# Patient Record
Sex: Male | Born: 1995 | Hispanic: Yes | Marital: Single | Smoking: Never smoker
Health system: Southern US, Community
[De-identification: ages and names within clinical notes are randomized; demographics above are authoritative.]

## PROBLEM LIST (undated history)

## (undated) DIAGNOSIS — G43909 Migraine, unspecified, not intractable, without status migrainosus: Secondary | ICD-10-CM

---
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
## 2014-06-26 ENCOUNTER — Emergency Department: Payer: Self-pay | Admitting: Emergency Medicine

## 2014-09-11 ENCOUNTER — Emergency Department: Payer: Self-pay | Admitting: Internal Medicine

## 2014-09-12 ENCOUNTER — Emergency Department: Payer: Self-pay | Admitting: Emergency Medicine

## 2014-12-04 ENCOUNTER — Emergency Department: Payer: Self-pay | Admitting: Emergency Medicine

## 2015-03-15 IMAGING — CR DG CHEST 2V
1 series · 2 of 2 positions shown · non-contrast
Comparison: None.

CLINICAL DATA: Cough.

EXAM:
CHEST  2 VIEW

[Series 1: dxr chest pa (or ap) and lateral · 0.14mm/px · 2 of 2 slices shown]
[im 1/2]
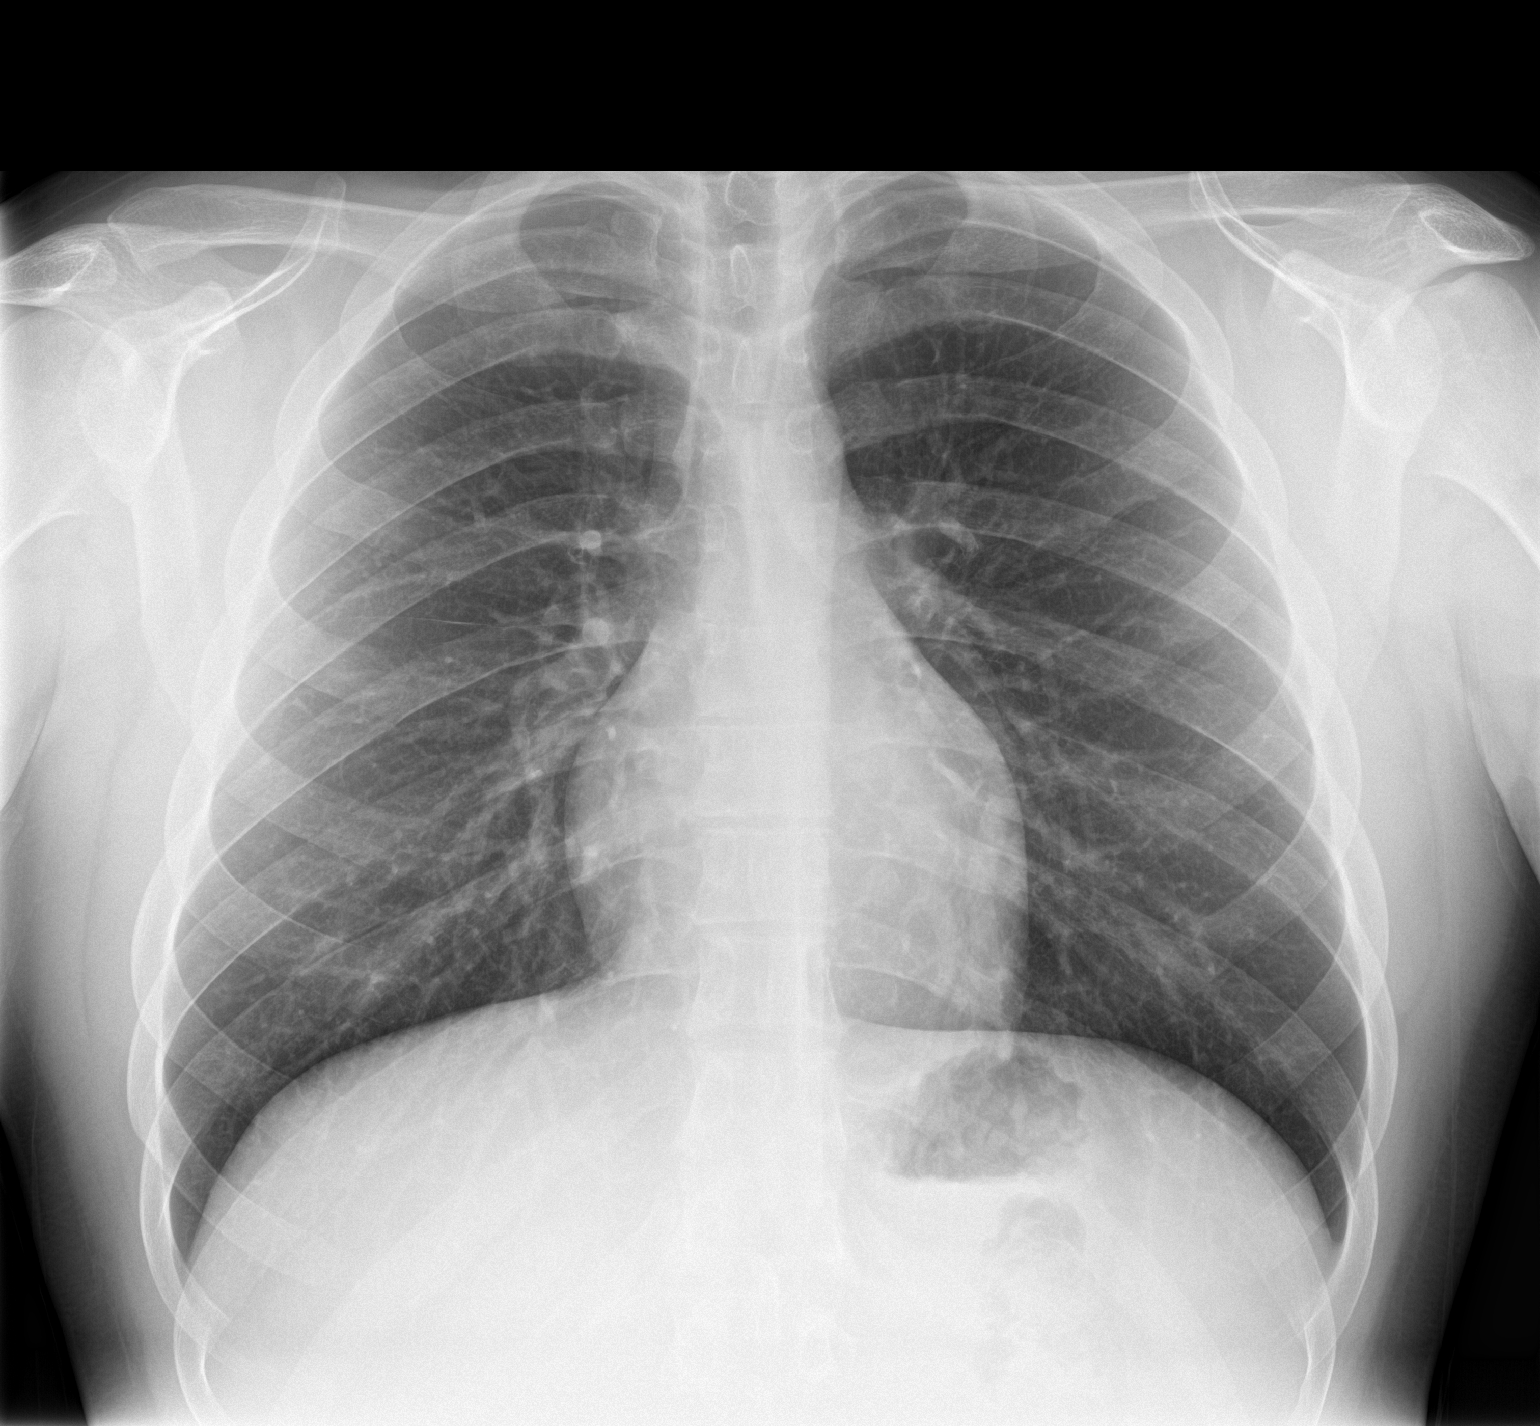
[im 2/2]
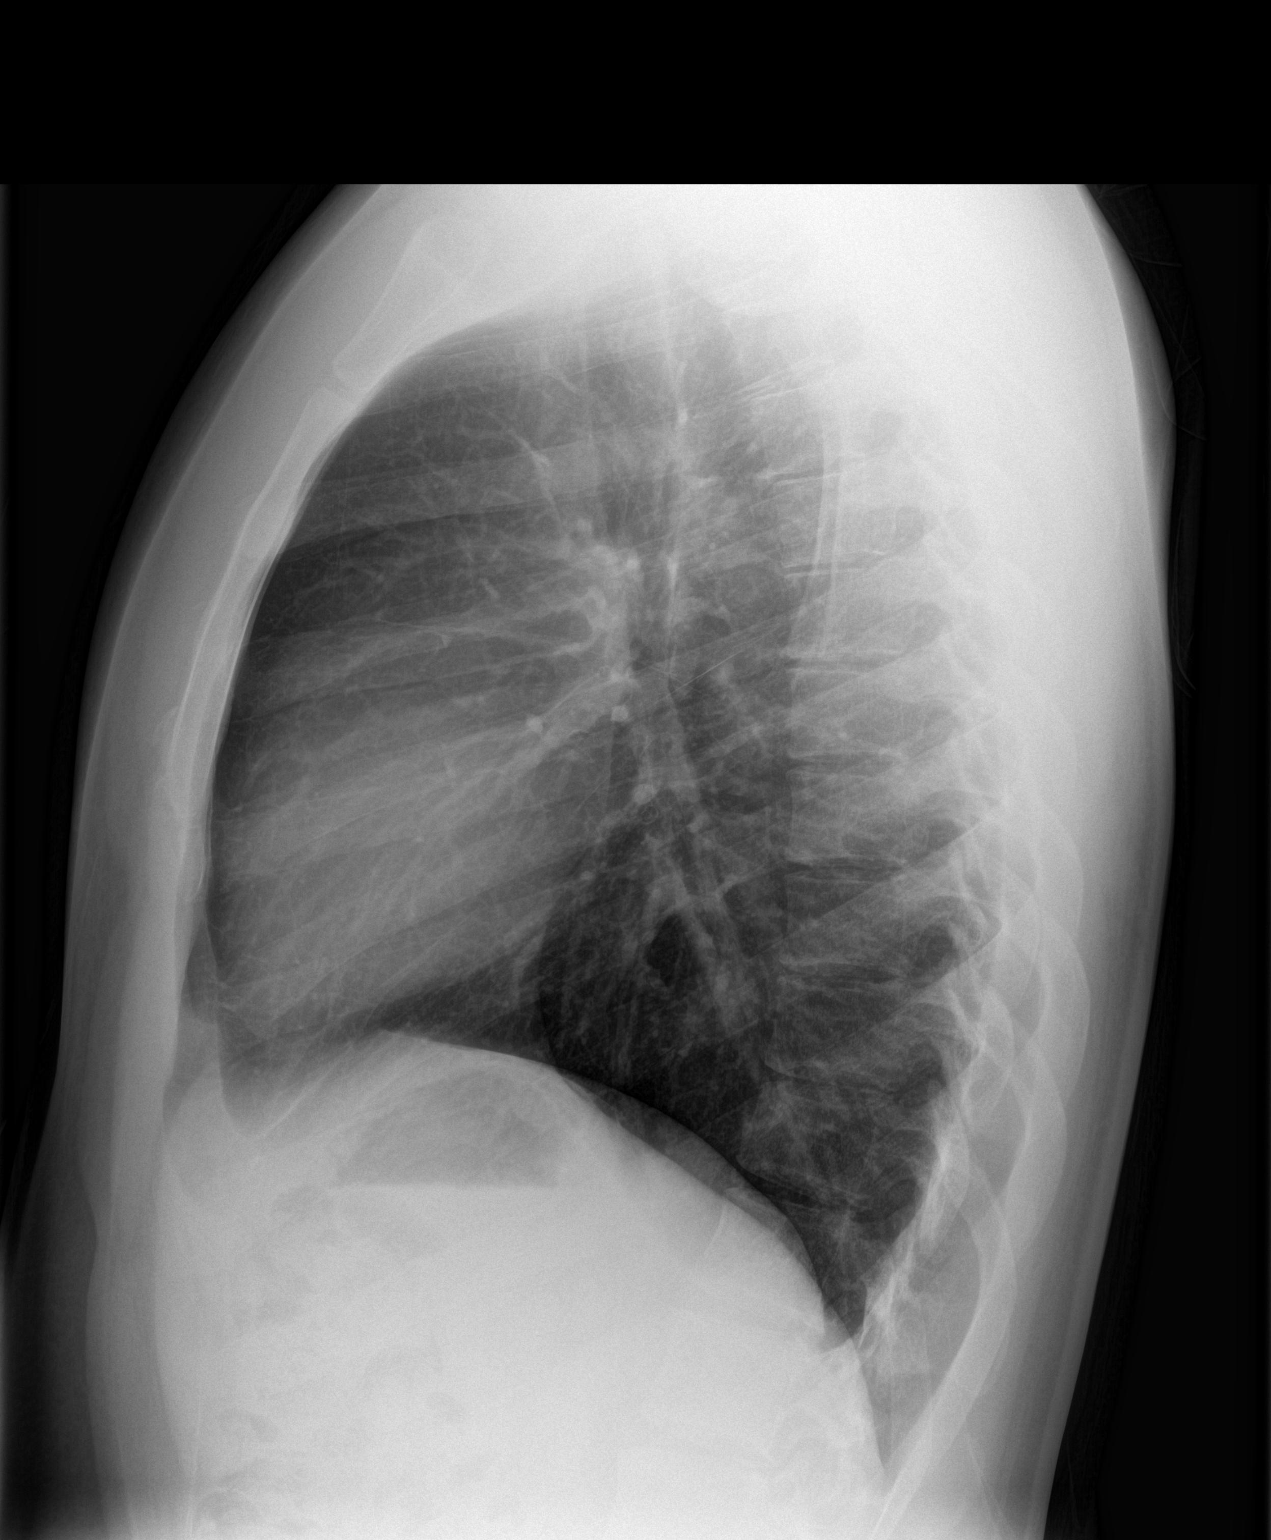

[2 of 2 positions shown; findings below may reference images not displayed]

FINDINGS: The heart size and mediastinal contours are within normal limits.
Both lungs are clear. The visualized skeletal structures are
unremarkable.
IMPRESSION: No active cardiopulmonary disease.

## 2015-08-23 IMAGING — CR DG CHEST 2V
1 series · 2 of 2 positions shown · non-contrast
Comparison: 06/26/2014

CLINICAL DATA: Chest pain, anxiety attack

EXAM:
CHEST  2 VIEW

[Series 1: dxr chest pa (or ap) and lateral · 0.14mm/px · 2 of 2 slices shown]
[im 1/2]
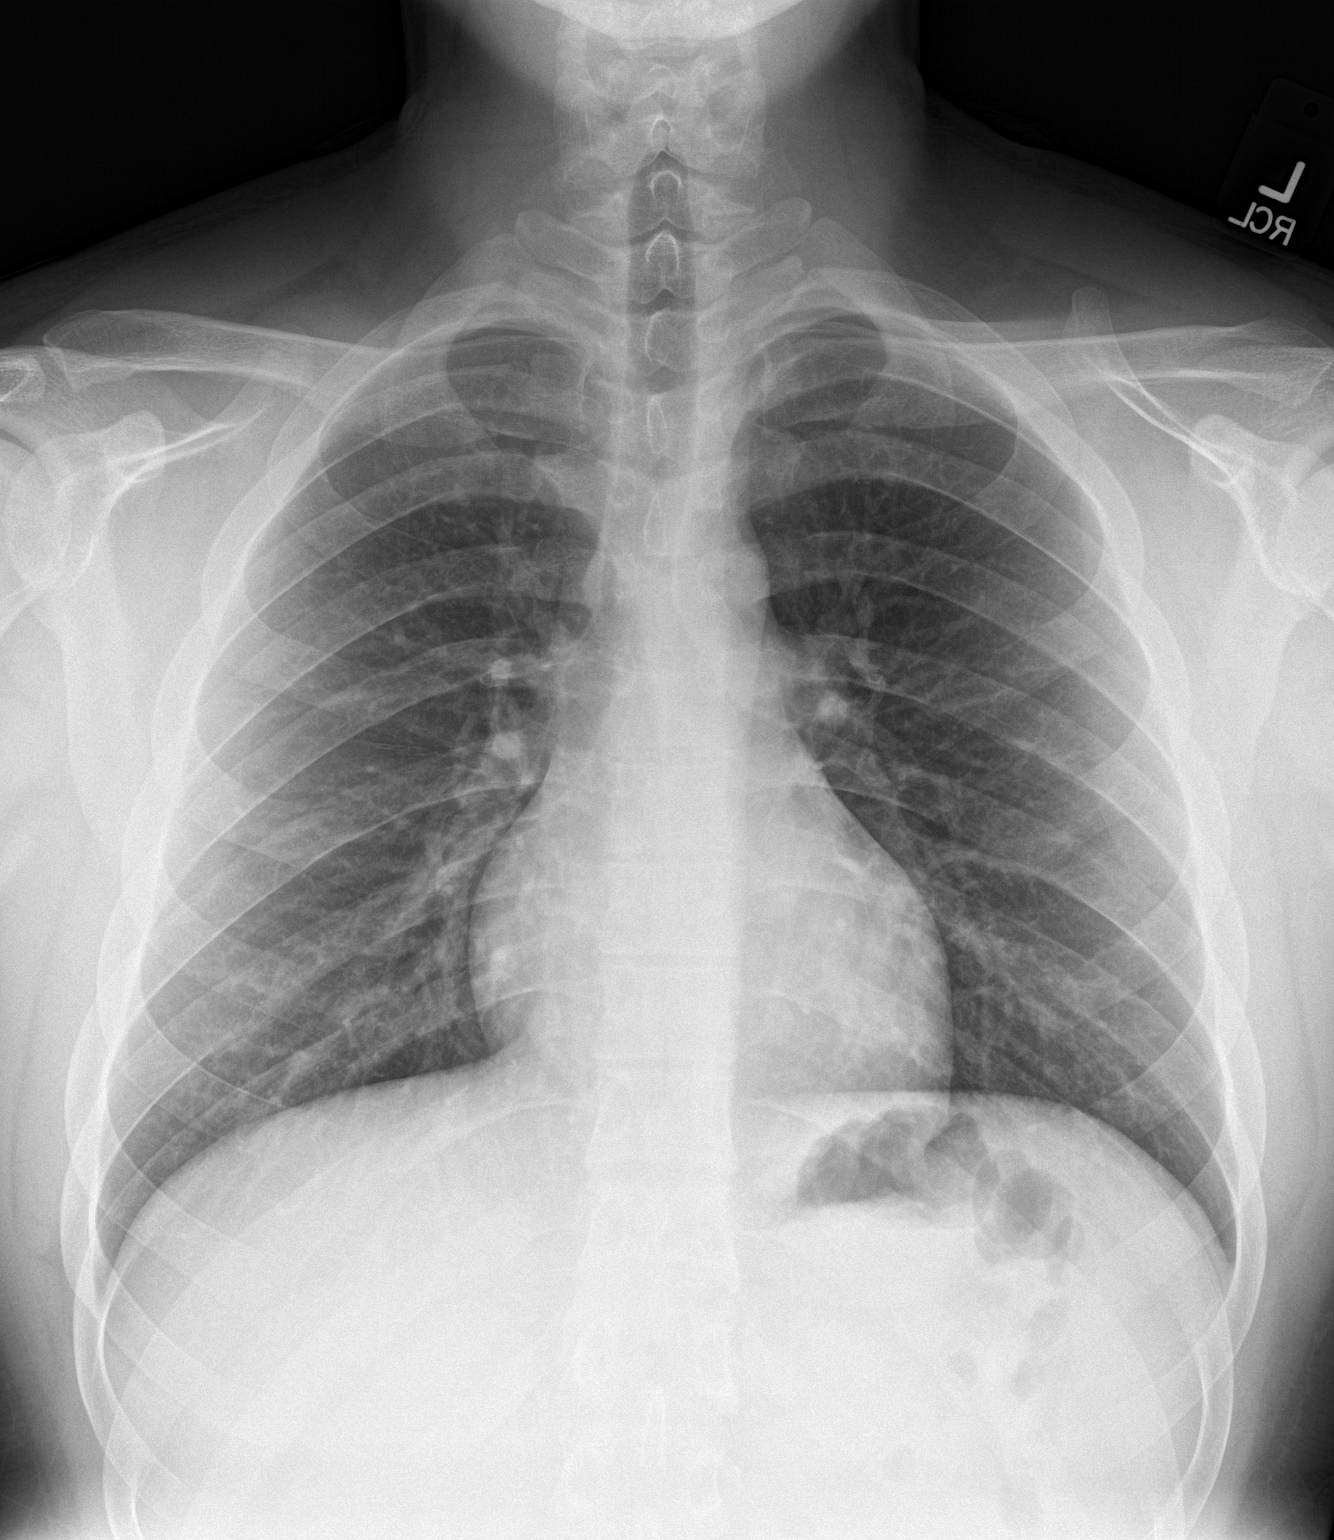
[im 2/2]
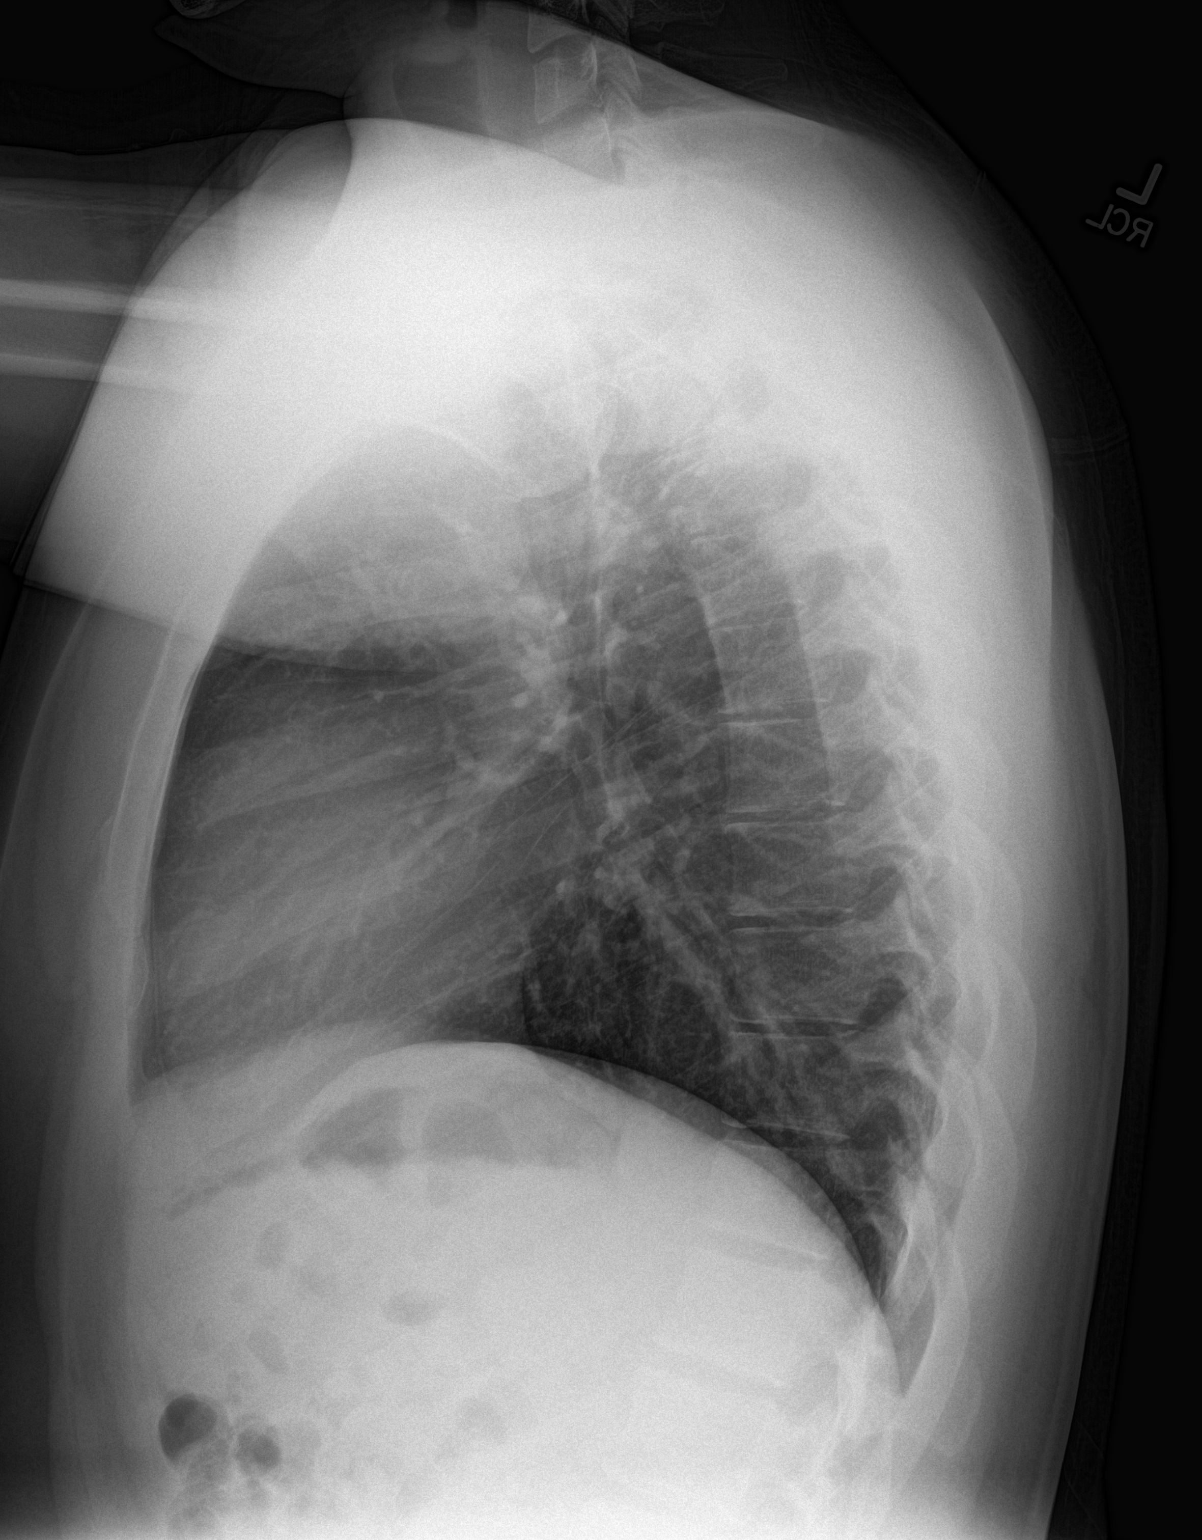

[2 of 2 positions shown; findings below may reference images not displayed]

FINDINGS: The heart size and mediastinal contours are within normal limits.
Both lungs are clear. The visualized skeletal structures are
unremarkable.
IMPRESSION: No active cardiopulmonary disease.

## 2022-04-29 ENCOUNTER — Ambulatory Visit (INDEPENDENT_AMBULATORY_CARE_PROVIDER_SITE_OTHER): Payer: Federal, State, Local not specified - PPO | Admitting: Podiatry

## 2022-04-29 DIAGNOSIS — B351 Tinea unguium: Secondary | ICD-10-CM | POA: Diagnosis not present

## 2022-04-29 MED ORDER — TERBINAFINE HCL 250 MG PO TABS: 250.0000 mg | ORAL_TABLET | Freq: Every day | ORAL | 0 refills | Status: DC

## 2022-04-29 NOTE — Progress Notes (Signed)
   Chief Complaint  Patient presents with   Nail Problem    Bilateral toenail fungus     Subjective: 26 y.o. male presenting today as a new patient for evaluation of toenail fungus that has been ongoing for several years now.  Patient served in [employer] for about 10 years and he states that is when he began to develop discoloration with thickening of the toenails.  He is very embarrassed about the toenails in their appearance.  He presents for further treatment and evaluation  No past medical history on file.   No Known Allergies  Objective: Physical Exam General: The patient is alert and oriented x3 in no acute distress.  Dermatology: Hyperkeratotic, discolored, thickened, onychodystrophy noted 1-5 bilateral. Skin is warm, dry and supple bilateral lower extremities. Negative for open lesions or macerations.  Vascular: Palpable pedal pulses bilaterally. No edema or erythema noted. Capillary refill within normal limits.  Neurological: Epicritic and protective threshold grossly intact bilaterally.   Musculoskeletal Exam: No pedal deformity noted  Assessment: #1 Onychomycosis of toenails bilateral 1-5  Plan of Care:  #1 Patient was evaluated. #2  Today we discussed different treatment options including oral, topical, and laser antifungal treatment modalities.  We discussed their efficacies and side effects.  Patient opts for oral antifungal treatment modality #3 prescription for Lamisil 250 mg #90 daily. Pt denies a history of liver pathology or symptoms.  Patient is otherwise healthy.  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 Felecia Shelling, DPM    [address] [place], [place] [address]                Office [phone]  Fax [phone]

## 2022-10-28 ENCOUNTER — Ambulatory Visit: Payer: Federal, State, Local not specified - PPO | Admitting: Podiatry

## 2022-11-08 ENCOUNTER — Ambulatory Visit: Payer: Federal, State, Local not specified - PPO | Admitting: Podiatry

## 2023-01-24 ENCOUNTER — Ambulatory Visit: Payer: Federal, State, Local not specified - PPO | Admitting: Podiatry

## 2023-06-06 ENCOUNTER — Encounter: Payer: Self-pay | Admitting: Podiatry

## 2023-06-06 ENCOUNTER — Ambulatory Visit (INDEPENDENT_AMBULATORY_CARE_PROVIDER_SITE_OTHER): Payer: Federal, State, Local not specified - PPO | Admitting: Podiatry

## 2023-06-06 DIAGNOSIS — L600 Ingrowing nail: Secondary | ICD-10-CM

## 2023-06-06 NOTE — Progress Notes (Signed)
   Chief Complaint  Patient presents with   Ingrown Toenail    Previously removed still bothersome    Subjective: Patient presents today for evaluation of pain to the lateral border right great toe. Patient is concerned for possible ingrown nail.  It is very sensitive to touch.  Patient presents today for further treatment and evaluation.  History reviewed. No pertinent past medical history.  History reviewed. No pertinent surgical history.  No Known Allergies  Objective:  General: Well developed, nourished, in no acute distress, alert and oriented x3   Dermatology: Skin is warm, dry and supple bilateral.  Lateral border right great toe is tender with evidence of an ingrowing nail. Pain on palpation noted to the border of the nail fold. The remaining nails appear unremarkable at this time.   Vascular: DP and PT pulses palpable.  No clinical evidence of vascular compromise  Neruologic: Grossly intact via light touch bilateral.  Musculoskeletal: No pedal deformity noted  Assesement: #1 Paronychia with ingrowing nail lateral border right great toe #2 history of partial nail matricectomy medial lateral border bilateral great toes at [facility] clinic about 3 months ago  Plan of Care:  -Patient evaluated.  -Discussed treatment alternatives and plan of care. Explained nail avulsion procedure and post procedure course to patient. -Patient opted for permanent partial nail avulsion of the ingrown portion of the nail.  -Prior to procedure, local anesthesia infiltration utilized using 3 ml of a 50:50 mixture of 2% plain lidocaine and 0.5% plain marcaine in a normal hallux block fashion and a betadine prep performed.  -Partial permanent nail avulsion with chemical matrixectomy performed using 3x30sec applications of phenol followed by alcohol flush.  -Light dressing applied.  Post care instructions provided -Return to clinic 3 weeks  Felecia Shelling, DPM [facility]  Dr.  Felecia Shelling, DPM    [address] [place], [place] [address]                Office [phone]  Fax [phone]

## 2023-09-16 ENCOUNTER — Ambulatory Visit
Admission: RE | Admit: 2023-09-16 | Discharge: 2023-09-16 | Disposition: A | Discharge status: Home / Self Care | Payer: Worker's Compensation | Source: Ambulatory Visit | Attending: Emergency Medicine | Admitting: Emergency Medicine

## 2023-09-16 ENCOUNTER — Ambulatory Visit: Payer: No Typology Code available for payment source

## 2023-09-16 VITALS — BP 130/87 | HR 78 | Temp 98.9°F | Resp 19

## 2023-09-16 DIAGNOSIS — M545 Low back pain, unspecified: Secondary | ICD-10-CM

## 2023-09-16 DIAGNOSIS — M546 Pain in thoracic spine: Secondary | ICD-10-CM

## 2023-09-16 DIAGNOSIS — S161XXA Strain of muscle, fascia and tendon at neck level, initial encounter: Secondary | ICD-10-CM

## 2023-09-16 MED ORDER — BACLOFEN 10 MG PO TABS: 10.0000 mg | ORAL_TABLET | Freq: Three times a day (TID) | ORAL | 0 refills | Status: DC

## 2023-09-16 MED ORDER — IBUPROFEN 600 MG PO TABS: 600.0000 mg | ORAL_TABLET | Freq: Four times a day (QID) | ORAL | 0 refills | Status: DC | PRN

## 2023-09-16 NOTE — Discharge Instructions (Addendum)
Take the ibuprofen, 600 mg every 6 hours with food, on a schedule for the next 48 hours and then as needed.  Take the baclofen, 10 mg every 8 hours, on a schedule for the next 48 hours and then as needed.  These medication should also help with your headache.  Apply moist heat to your neck/back for 30 minutes at a time 2-3 times a day to improve blood flow to the area and help remove the lactic acid causing the spasm.  Follow the neck/back exercises given at discharge.  If your symptoms do not improve, new symptoms develop, or your symptoms worsen I recommend that you follow-up with orthopedics, such as [facility] here in [place] or [place].

## 2023-09-16 NOTE — ED Triage Notes (Signed)
09/15/23 @ 8:30 am Feel on my back at work. Workers comp CA-1 -   Patient states that he woke up this morning with back hurting stiff neck and migraine

## 2023-09-16 NOTE — ED Provider Notes (Addendum)
[facility]    CSN: [id number] Arrival date & time: 09/16/23  1409      History   Chief Complaint Chief Complaint  Patient presents with   Back Pain    Feel on my back at work. Workers comp CA-1 - Entered by patient   Fall   Torticollis   Migraine    HPI Shaun Jones is a 27 y.o. male.   HPI  27 year old male with no significant past medical history presents for evaluation of back pain, neck stiffness, and headache after suffering a ground-level fall onto concrete yesterday.  He reports that he slipped on ice and landed flat on his back.  He did not strike his head and did not have a loss of consciousness.  He states he is having some numbness along the left lateral edge of his thoracic spine but no numbness, tingling, or weakness in any of his extremities.  He denies any saddle anesthesia or fecal or urinary incontinence.  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  History reviewed. No pertinent past medical history.  There are no active problems to display for this patient.   History reviewed. No pertinent surgical history.     Home Medications    Prior to Admission medications   Medication Sig Start Date End Date Taking? Authorizing Provider  baclofen (LIORESAL) 10 MG tablet Take 1 tablet (10 mg total) by mouth 3 (three) times daily. 09/16/23  Yes Becky Augusta, NP  ibuprofen (ADVIL) 600 MG tablet Take 1 tablet (600 mg total) by mouth every 6 (six) hours as needed. 09/16/23  Yes Becky Augusta, NP  terbinafine (LAMISIL) 250 MG tablet Take 1 tablet (250 mg total) by mouth daily. 04/29/22   Felecia Shelling, DPM    Family History History reviewed. No pertinent family history.  Social History Social History   Tobacco Use   Smoking status: Never    Passive exposure: Never   Smokeless tobacco: Never  Vaping Use   Vaping status: Never Used  Substance Use Topics   Alcohol use: Yes   Drug use: Never     Allergies    Pollen extract   Review of Systems Review of Systems  Musculoskeletal:  Positive for back pain and neck pain.  Neurological:  Positive for numbness. Negative for weakness.     Physical Exam Triage Vital Signs ED Triage Vitals  Encounter Vitals Group     BP 09/16/23 1422 130/87     Systolic BP Percentile --      Diastolic BP Percentile --      Pulse Rate 09/16/23 1422 78     Resp 09/16/23 1422 19     Temp 09/16/23 1422 98.9 F (37.2 C)     Temp Source 09/16/23 1422 Oral     SpO2 09/16/23 1422 99 %     Weight --      Height --      Head Circumference --      Peak Flow --      Pain Score 09/16/23 1421 4     Pain Loc --      Pain Education --      Exclude from Growth Chart --    No data found.  Updated Vital Signs BP 130/87 (BP Location: Left Arm)   Pulse 78   Temp 98.9 F (37.2 C) (Oral)   Resp 19   SpO2 99%   Visual Acuity Right Eye Distance:   Left  Eye Distance:   Bilateral Distance:    Right Eye Near:   Left Eye Near:    Bilateral Near:     Physical Exam Vitals and nursing note reviewed.  Constitutional:      Appearance: Normal appearance. He is not ill-appearing.  HENT:     Head: Normocephalic and atraumatic.  Musculoskeletal:        General: Tenderness and signs of injury present. No swelling or deformity. Normal range of motion.  Skin:    General: Skin is warm and dry.     Capillary Refill: Capillary refill takes less than 2 seconds.  Neurological:     General: No focal deficit present.     Mental Status: He is alert and oriented to person, place, and time.      UC Treatments / Results  Labs (all labs ordered are listed, but only abnormal results are displayed) Labs Reviewed - No data to display  EKG   Radiology No results found.  Procedures Procedures (including critical care time)  Medications Ordered in UC Medications - No data to display  Initial Impression / Assessment and Plan / UC Course  I have reviewed the triage  vital signs and the nursing notes.  Pertinent labs & imaging results that were available during my care of the patient were reviewed by me and considered in my medical decision making (see chart for details).   Patient is a nontoxic-appearing 27 year old male presenting for evaluation of neck stiffness, headache, and back pain after suffering a ground-level fall after slipping on ice yesterday at work.  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  He reports that he was walking and slipped on ice from a standing height and landed flat on his back but he did not strike his head or have a loss of consciousness.  In the exam room he is moving all extremities independently.  Bilateral grips and upper extremity strength are 5/5 in bilateral lower extremity strength is 5/5.  He has no cervical spinous process tenderness or step-off.  He does have tenderness but no step-off of T5 and T10 as well as L1 and L3.  The remainder of the spinal column is unremarkable.  He does have some lumbar and thoracic paraspinous muscle tenderness without overt spasm or trigger point.  He is not having any saddle anesthesia or fecal or urinary incontinence.  I suspect that patient's injuries are all musculoskeletal in nature.  I will obtain radiographs of thoracic and lumbar spine to rule out any bony injury.  Thoracic spine x-rays independently reviewed and evaluated by me.  Impression: No evidence of fracture or dislocation.  Disc spaces are normal.  Radiology overread is pending. Radiology impression states normal thoracic spine radiographs.  Lumbar spine x-rays independently reviewed and evaluated by me.  Impression: No evidence of fracture or dislocation.  Disc spaces appear normal.  There is a loss of lumbar lordosis.  Radiology overread is pending. Radiology impression states minimal left L1-L2 to space narrowing minimal bilateral L5-S1 facet joint degenerative changes.  I will discharge patient  home with a diagnosis of thoracic and lumbar back pain status post fall as well as cervical strain.  I will start him on ibuprofen 600 mg every 6 hours along with baclofen 10 mg every 8 hours for inflammation and muscle spasm.  Moist heat and home physical therapy exercises/range of motion exercises provided.  If his symptoms do not improve, or they worsen, he should follow-up with  orthopedics.   Final Clinical Impressions(s) / UC Diagnoses   Final diagnoses:  Cervical strain, acute, initial encounter  Acute bilateral thoracic back pain  Acute bilateral low back pain without sciatica     Discharge Instructions      Take the ibuprofen, 600 mg every 6 hours with food, on a schedule for the next 48 hours and then as needed.  Take the baclofen, 10 mg every 8 hours, on a schedule for the next 48 hours and then as needed.  These medication should also help with your headache.  Apply moist heat to your neck/back for 30 minutes at a time 2-3 times a day to improve blood flow to the area and help remove the lactic acid causing the spasm.  Follow the neck/back exercises given at discharge.  If your symptoms do not improve, new symptoms develop, or your symptoms worsen I recommend that you follow-up with orthopedics, such as [facility] here in [place] or [place].     ED Prescriptions     Medication Sig Dispense Auth. Provider   ibuprofen (ADVIL) 600 MG tablet Take 1 tablet (600 mg total) by mouth every 6 (six) hours as needed. 30 tablet Becky Augusta, NP   baclofen (LIORESAL) 10 MG tablet Take 1 tablet (10 mg total) by mouth 3 (three) times daily. 30 each Becky Augusta, NP      PDMP not reviewed this encounter.   Becky Augusta, NP 09/16/23 1536    Becky Augusta, NP 09/16/23 1536    Becky Augusta, NP 09/16/23 1550

## 2023-10-12 ENCOUNTER — Encounter: Payer: Self-pay | Admitting: Podiatry

## 2023-10-12 ENCOUNTER — Ambulatory Visit (INDEPENDENT_AMBULATORY_CARE_PROVIDER_SITE_OTHER): Payer: Federal, State, Local not specified - PPO | Admitting: Podiatry

## 2023-10-12 DIAGNOSIS — L600 Ingrowing nail: Secondary | ICD-10-CM | POA: Diagnosis not present

## 2023-10-12 DIAGNOSIS — L539 Erythematous condition, unspecified: Secondary | ICD-10-CM | POA: Diagnosis not present

## 2023-10-12 MED ORDER — DOXYCYCLINE HYCLATE 100 MG PO TABS: 100.0000 mg | ORAL_TABLET | Freq: Two times a day (BID) | ORAL | 0 refills | Status: DC

## 2023-10-12 NOTE — Progress Notes (Signed)
  Subjective:  Patient ID: Shaun Jones, male    DOB: 20-Oct-1995,  MRN: [id number]  Chief Complaint  Patient presents with   Nail Problem    The ingrown has gotten worse.    28 y.o. male presents with the above complaint.  Patient presents with right hallux lateral border ingrown recurrence.  Patient states that it is causing him some pain.  He had a tree fell on it and has been acting up.  He wanted get evaluated is not currently on any antibiotics.  Denies any other acute complaints.   Review of Systems: Negative except as noted in the HPI. Denies N/V/F/Ch.  No past medical history on file.  Current Outpatient Medications:    doxycycline  (VIBRA -TABS) 100 MG tablet, Take 1 tablet (100 mg total) by mouth 2 (two) times daily., Disp: 20 tablet, Rfl: 0   baclofen  (LIORESAL ) 10 MG tablet, Take 1 tablet (10 mg total) by mouth 3 (three) times daily. (Patient not taking: Reported on 10/12/2023), Disp: 30 each, Rfl: 0   ibuprofen  (ADVIL ) 600 MG tablet, Take 1 tablet (600 mg total) by mouth every 6 (six) hours as needed. (Patient not taking: Reported on 10/12/2023), Disp: 30 tablet, Rfl: 0   terbinafine  (LAMISIL ) 250 MG tablet, Take 1 tablet (250 mg total) by mouth daily. (Patient not taking: Reported on 10/12/2023), Disp: 90 tablet, Rfl: 0  Social History   Tobacco Use  Smoking Status Never   Passive exposure: Never  Smokeless Tobacco Never    Allergies  Allergen Reactions   Pollen Extract Rash    Skin irritation   Objective:  There were no vitals filed for this visit. There is no height or weight on file to calculate BMI. Constitutional Well developed. Well nourished.  Vascular Dorsalis pedis pulses palpable bilaterally. Posterior tibial pulses palpable bilaterally. Capillary refill normal to all digits.  No cyanosis or clubbing noted. Pedal hair growth normal.  Neurologic Normal speech. Oriented to person, place, and time. Epicritic sensation to light touch grossly  present bilaterally.  Dermatologic Painful ingrowing nail at lateral nail borders of the hallux nail right. No other open wounds. No skin lesions.  Orthopedic: Normal joint ROM without pain or crepitus bilaterally. No visible deformities. No bony tenderness.   Radiographs: None Assessment:   1. Erythema   2. Ingrown toenail of right foot    Plan:  Patient was evaluated and treated and all questions answered.  Ingrown Nail, right with under lying mild erythema/paronychia noted -Patient elects to proceed with minor surgery to remove ingrown toenail removal today. Consent reviewed and signed by patient. -Ingrown nail excised. See procedure note. -Educated on post-procedure care including soaking. Written instructions provided and reviewed. -Patient to follow up in 2 weeks for nail check.  Procedure: Excision of Ingrown Toenail Location: Right 1st toe lateral nail borders. Anesthesia: Lidocaine 1% plain; 1.5 mL and Marcaine 0.5% plain; 1.5 mL, digital block. Skin Prep: Betadine. Dressing: Silvadene; telfa; dry, sterile, compression dressing. Technique: Following skin prep, the toe was exsanguinated and a tourniquet was secured at the base of the toe. The affected nail border was freed, split with a nail splitter, and excised. Chemical matrixectomy was then performed with phenol and irrigated out with alcohol. The tourniquet was then removed and sterile dressing applied. Disposition: Patient tolerated procedure well. Patient to return in 2 weeks for follow-up.   No follow-ups on file.

## 2023-10-27 ENCOUNTER — Ambulatory Visit: Payer: Federal, State, Local not specified - PPO | Admitting: Podiatry

## 2023-11-09 ENCOUNTER — Telehealth: Payer: Federal, State, Local not specified - PPO | Admitting: Physician Assistant

## 2023-11-09 DIAGNOSIS — L700 Acne vulgaris: Secondary | ICD-10-CM

## 2023-11-09 MED ORDER — CLINDAMYCIN PHOS-BENZOYL PEROX 1-5 % EX GEL: Freq: Two times a day (BID) | CUTANEOUS | 0 refills | Status: DC

## 2023-11-09 NOTE — Progress Notes (Signed)
 E-Visit for Acne   We are sorry that you are experiencing this issue.  Here is how we plan to help!  Based on what you shared with me it looks like you have acne.  Acne is a disorder of the hair follicles and oil glands (sebaceous glands). The sebaceous glands secrete oils to keep the skin moist.  When the glands get clogged, it can lead to pimples or cysts.  These cysts may become infected and leave scars. Acne is very common and normally occurs at puberty.  Acne is also inherited.  Your personal care plan consists of the following recommendations:  I recommend that you use a daily cleanser  You might try 2% topical salicylic acid pads or wipes.  Use the pads to daily cleanse your skin.  I have prescribed a topical gel with an antibiotic:  Clindamycin -benzoyl peroxide gel.  This gel should be applied to the affected areas twice a day.  Be sure to read the package insert to understand potential side effects.  If excessive dryness or peeling occurs, reduce dose frequency or concentration of the topical scrubs.  If excessive stinging or burning occurs, remove the topical gel with mild soap and water and resume at a lower dose the next day.  Remember oral antibiotics and topical acne treatments may increase your sensitivity to the sun!  HOME CARE: Do not squeeze pimples because that can often lead to infections, worse acne, and scars. Use a moisturizer that contains retinoid or fruit acids that may inhibit the development of new acne lesions. Although there is not a clear link that foods can cause acne, doctors do believe that too many sweets predispose you to skin problems.  GET HELP RIGHT AWAY IF: If your acne gets worse or is not better within 10 days. If you become depressed. If you become pregnant, discontinue medications and call your OB/GYN.  MAKE SURE YOU: Understand these instructions. Will watch your condition. Will get help right away if you are not doing well or get  worse.  Thank you for choosing an e-visit.  Your e-visit answers were reviewed by a board certified advanced clinical practitioner to complete your personal care plan. Depending upon the condition, your plan could have included both over the counter or prescription medications.  Please review your pharmacy choice. Make sure the pharmacy is open so you can pick up prescription now. If there is a problem, you may contact your provider through [employer] and have the prescription routed to another pharmacy.  Your safety is important to us . If you have drug allergies check your prescription carefully.   For the next 24 hours you can use MyChart to ask questions about today's visit, request a non-urgent call back, or ask for a work or school excuse. You will get an email in the next two days asking about your experience. I hope that your e-visit has been valuable and will speed your recovery.

## 2023-11-09 NOTE — Progress Notes (Signed)
 I have spent 5 minutes in review of e-visit questionnaire, review and updating patient chart, medical decision making and response to patient.   Piedad Climes, PA-C

## 2023-12-29 ENCOUNTER — Ambulatory Visit (INDEPENDENT_AMBULATORY_CARE_PROVIDER_SITE_OTHER): Admitting: Podiatry

## 2023-12-29 ENCOUNTER — Encounter: Payer: Self-pay | Admitting: Podiatry

## 2023-12-29 DIAGNOSIS — L600 Ingrowing nail: Secondary | ICD-10-CM

## 2023-12-29 NOTE — Progress Notes (Signed)
   Chief Complaint  Patient presents with   Ingrown Toenail    Need to discuss about permanently removing the possibility of ingrown toe nail. One where I will not feel anything or being put to sleep. I will feel and do not wish to have ingrown occurred. Trying to lose weight but can't due to having g same issue and can't work out. I need a permanent solution Right hallux total    Subjective: Patient presents today for evaluation of pain to the lateral border right great toe. Patient is concerned for possible ingrown nail.  It is very sensitive to touch.  Unfortunately the patient has had multiple partial nail matricectomy's to this area.  Both here in our practice as well as [place] clinic.  Patient presents today for further treatment and evaluation.  History reviewed. No pertinent past medical history.  History reviewed. No pertinent surgical history.  Allergies  Allergen Reactions   Pollen Extract Rash    Skin irritation    Objective:  General: Well developed, nourished, in no acute distress, alert and oriented x3   Dermatology: Skin is warm, dry and supple bilateral.  Lateral border right great toe is tender with evidence of an ingrowing nail. Pain on palpation noted to the border of the nail fold. The remaining nails appear unremarkable at this time.   Vascular: DP and PT pulses palpable.  No clinical evidence of vascular compromise  Neruologic: Grossly intact via light touch bilateral.  Musculoskeletal: No pedal deformity noted  Assesement: #1 Paronychia with ingrowing nail lateral border right great toe  Plan of Care:  -Patient evaluated.  We did discuss the possibility of total permanent nail avulsion however he is asymptomatic for the majority of the nail plate with exception of the lateral border.  I do not see any benefit in totally permanently removing the entire nail plate. -Discussed treatment alternatives and plan of care. Explained nail avulsion procedure and  post procedure course to patient. -Patient opted for permanent partial nail avulsion of the ingrown portion of the nail.  -Prior to procedure, local anesthesia infiltration utilized using 3 ml of a 50:50 mixture of 2% plain lidocaine and 0.5% plain marcaine in a normal hallux block fashion and a betadine prep performed.  -Partial permanent nail avulsion with chemical matrixectomy performed using 3x30sec applications of phenol followed by alcohol flush.  -Light dressing applied.  Post care instructions provided -Return to clinic 3 weeks  Felecia Shelling, DPM [facility]  Dr. Felecia Shelling, DPM    [address] [place], [place] [address]                Office [phone]  Fax [phone]

## 2024-01-23 ENCOUNTER — Encounter: Payer: Self-pay | Admitting: Podiatry

## 2024-01-23 ENCOUNTER — Ambulatory Visit (INDEPENDENT_AMBULATORY_CARE_PROVIDER_SITE_OTHER): Admitting: Podiatry

## 2024-01-23 DIAGNOSIS — L600 Ingrowing nail: Secondary | ICD-10-CM | POA: Diagnosis not present

## 2024-01-23 NOTE — Progress Notes (Signed)
   Chief Complaint  Patient presents with   Nail Problem    "It recovered, no more leakage."    Subjective: 28 y.o. male presents today status post permanent nail avulsion procedure of the lateral border right great toe that was performed on 12/29/2023.  Patient doing well.  No drainage.   No past medical history on file.  Objective: Neurovascular status intact.  Skin is warm, dry and supple. Nail and respective nail fold appears to be healing appropriately.   Assessment: #1 s/p partial permanent nail matrixectomy lateral border right great toe   Plan of care: #1 patient was evaluated  #2 light debridement of the periungual debris was performed to the border of the respective toe and nail plate using a tissue nipper. #3 patient is to return to clinic on a PRN basis.   Dot Gazella, DPM [facility]  Dr. Dot Gazella, DPM    [address] [place], [place] [address]                Office [phone]  Fax [phone]

## 2024-04-07 ENCOUNTER — Encounter: Payer: Self-pay | Admitting: Podiatry

## 2024-04-26 ENCOUNTER — Ambulatory Visit (INDEPENDENT_AMBULATORY_CARE_PROVIDER_SITE_OTHER): Admitting: Podiatry

## 2024-04-26 ENCOUNTER — Encounter: Payer: Self-pay | Admitting: Podiatry

## 2024-04-26 DIAGNOSIS — L600 Ingrowing nail: Secondary | ICD-10-CM

## 2024-04-30 NOTE — Progress Notes (Signed)
   Chief Complaint  Patient presents with   Ingrown Toenail    Pt is here due to ingrown on right great toenail, he states that this is a recurrent issue and does not want to have the procedure to remove the ingrown again, wants to see is there an alternate method, he is considering having toe amputated.     Subjective: 28 y.o. male presents today status post permanent nail avulsion procedure of the lateral border right great toe that was performed on 12/29/2023.  Patient doing well.  No drainage.   No past medical history on file.  Objective: Neurovascular status intact.  Skin is warm, dry and supple. Nail and respective nail fold appears to be healing appropriately.  No infection or inflammation.  There is a small portion of nail plate which appears to be slightly interfering with the soft tissue nail fold  Assessment: #1 h/o partial permanent nail matrixectomy lateral border right great toe   Plan of care: -Patient was evaluated  -Currently the partial nail matricectomy site appears stable with decent healing.  There is a small portion of nail plate which does  appear to be slightly intruding into the nail fold.  Simple debridement was offered but the patient declined -Recommend massaging the soft tissue nail fold away from the nail plate daily -Return to clinic PRN   Thresa EMERSON Sar, DPM [facility]  Dr. Thresa EMERSON Sar, DPM    [address] [place], [place] [address]                Office [phone]  Fax [phone]

## 2024-05-28 ENCOUNTER — Ambulatory Visit (INDEPENDENT_AMBULATORY_CARE_PROVIDER_SITE_OTHER): Admitting: Podiatry

## 2024-05-28 ENCOUNTER — Encounter: Payer: Self-pay | Admitting: Podiatry

## 2024-05-28 DIAGNOSIS — L600 Ingrowing nail: Secondary | ICD-10-CM

## 2024-05-28 NOTE — Progress Notes (Signed)
   Chief Complaint  Patient presents with   Ingrown Toenail    Pt is here to f/u on right great toenail after having ingrown removed, he states everything is going well and has no complaints.    Subjective: 28 y.o. male presents today status post permanent nail avulsion procedure of the lateral border right great toe that was performed on 12/29/2023.  Patient states that he is doing very well and the pain and sensitivity to the ingrown are resolved.  No past medical history on file.  Objective: Neurovascular status intact.  Skin is warm, dry and supple. Nail and respective nail fold appears to be healing appropriately.  No infection or inflammation.  There is a small portion of nail plate which appears to be slightly interfering with the soft tissue nail fold  Assessment: #1 h/o partial permanent nail matrixectomy lateral border right great toe   Plan of care: -Patient was evaluated  - Light debridement of the nail plate was performed today -Overall the nail appears to be healing and growing out nicely -Return to clinic PRN   Thresa EMERSON Sar, DPM [facility]  Dr. Thresa EMERSON Sar, DPM    [address] [place], [place] [address]                Office [phone]  Fax [phone]

## 2024-09-08 ENCOUNTER — Encounter: Payer: Self-pay | Admitting: Emergency Medicine

## 2024-09-08 ENCOUNTER — Ambulatory Visit
Admission: EM | Admit: 2024-09-08 | Discharge: 2024-09-08 | Disposition: A | Discharge status: Home / Self Care | Attending: Emergency Medicine | Admitting: Emergency Medicine

## 2024-09-08 ENCOUNTER — Other Ambulatory Visit: Payer: Self-pay

## 2024-09-08 ENCOUNTER — Emergency Department
Admission: EM | Admit: 2024-09-08 | Discharge: 2024-09-08 | Disposition: A | Discharge status: Home / Self Care | Attending: Emergency Medicine | Admitting: Emergency Medicine

## 2024-09-08 ENCOUNTER — Telehealth: Payer: Self-pay | Admitting: Emergency Medicine

## 2024-09-08 DIAGNOSIS — J029 Acute pharyngitis, unspecified: Secondary | ICD-10-CM

## 2024-09-08 DIAGNOSIS — J36 Peritonsillar abscess: Secondary | ICD-10-CM

## 2024-09-08 HISTORY — DX: Migraine, unspecified, not intractable, without status migrainosus: G43.909

## 2024-09-08 LAB — POCT RAPID STREP A (OFFICE): Rapid Strep A Screen: NEGATIVE

## 2024-09-08 MED ORDER — PREDNISONE 10 MG (21) PO TBPK: ORAL_TABLET | ORAL | 0 refills | Status: AC

## 2024-09-08 MED ORDER — AMOXICILLIN 500 MG PO CAPS: 500.0000 mg | ORAL_CAPSULE | Freq: Three times a day (TID) | ORAL | 0 refills | Status: AC

## 2024-09-08 MED ORDER — AMOXICILLIN 500 MG PO CAPS: 500.0000 mg | ORAL_CAPSULE | Freq: Three times a day (TID) | ORAL | 0 refills | Status: DC

## 2024-09-08 MED ORDER — PREDNISONE 10 MG (21) PO TBPK: ORAL_TABLET | ORAL | 0 refills | Status: DC

## 2024-09-08 NOTE — Telephone Encounter (Signed)
 Asked to call in prescription to a 24-hour pharmacy.  Sent to pharmacy [facility] in [place].

## 2024-09-08 NOTE — ED Triage Notes (Signed)
 Pt comes in via pov from [facility] urgent care to rule out peritonsillar cellulitis. Pt complains of throat discomfort for the past 3-4 days. Pt complains of pain 8-10 at this time.

## 2024-09-08 NOTE — ED Provider Notes (Signed)
 [facility] Provider Note    Event Date/Time   First MD Initiated Contact with Patient 09/08/24 1719     (approximate)   History   Sore Throat   HPI  Riggin Cuttino Jordynn Perrier is a 28 y.o. male history of migraines presents emergency department from [facility] urgent care.  Provider saw him at [facility] urgent care and suggested that he return the next day if worsening or he could go to the ED.  Patient decided come to the ED today.  Patient states he is concerned he has a peritonsillar abscess.  He has no muffled voice, has had no difficulty swallowing, states he just had a sore throat.  No fever or chills.      Physical Exam   Triage Vital Signs: ED Triage Vitals  Encounter Vitals Group     BP 09/08/24 1553 (!) 144/85     Girls Systolic BP Percentile --      Girls Diastolic BP Percentile --      Boys Systolic BP Percentile --      Boys Diastolic BP Percentile --      Pulse Rate 09/08/24 1553 66     Resp 09/08/24 1553 16     Temp 09/08/24 1553 99.2 F (37.3 C)     Temp src --      SpO2 09/08/24 1553 100 %     Weight 09/08/24 1554 199 lb 3 oz (90.4 kg)     Height 09/08/24 1554 5' 7 (1.702 m)     Head Circumference --      Peak Flow --      Pain Score 09/08/24 1553 8     Pain Loc --      Pain Education --      Exclude from Growth Chart --     Most recent vital signs: Vitals:   09/08/24 1553  BP: (!) 144/85  Pulse: 66  Resp: 16  Temp: 99.2 F (37.3 C)  SpO2: 100%     General: Awake, no distress.   CV:  Good peripheral perfusion. regular rate and  rhythm Resp:  Normal effort. Lungs cta Abd:  No distention.   Other:  Throat with some irritation noted, no swelling noted on either side, no muffled voice, no difficulty swallowing saliva, neck is supple, no lymphadenopathy   ED Results / Procedures / Treatments   Labs (all labs ordered are listed, but only abnormal results are displayed) Labs Reviewed - No data to  display   EKG     RADIOLOGY     PROCEDURES:   Procedures  Critical Care:  no Chief Complaint  Patient presents with   Sore Throat      MEDICATIONS ORDERED IN ED: Medications - No data to display   IMPRESSION / MDM / ASSESSMENT AND PLAN / ED COURSE  I reviewed the triage vital signs and the nursing notes.                              Differential diagnosis includes, but is not limited to, sore throat, pharyngitis, peritonsillar abscess, cellulitis  Patient's presentation is most consistent with acute illness / injury with system symptoms.   On physical exam there is no immediate red flags to warrant a CT for peritonsillar abscess.  Patient does not have muffled voice, no one-sided swelling, no difficulty swallowing his saliva, etc.  At this time we will go ahead and place  him on amoxicillin  and Sterapred.  He can follow-up with his regular doctor as needed.  Return emergency department if he begins to have a muffled voice, difficulty swallowing his saliva etc.  He is in agreement with treatment plan.  Discharged stable condition.      FINAL CLINICAL IMPRESSION(S) / ED DIAGNOSES   Final diagnoses:  Pharyngitis, unspecified etiology     Rx / DC Orders   ED Discharge Orders          Ordered    amoxicillin  (AMOXIL ) 500 MG capsule  3 times daily        09/08/24 1727    predniSONE  (STERAPRED UNI-PAK 21 TAB) 10 MG (21) TBPK tablet        09/08/24 1727             Note:  This document was prepared using Dragon voice recognition software and may include unintentional dictation errors.    Gasper Devere ORN, PA-C 09/08/24 1734    Suzanne Kirsch, MD 09/08/24 2044

## 2024-09-08 NOTE — ED Provider Notes (Signed)
 HPI  SUBJECTIVE:  Patient reports left-sided sore throat starting 3 to 4 days ago.  He states that the left side is darker than the other side.  He reports trismus.  No fevers, body aches, headaches, nasal congestion, rhinorrhea, postnasal drip, cough, neck stiffness, sensation of throat swelling shut, voice changes, difficulty breathing, abdominal pain, rash, GERD or allergy symptoms.  No known strep, COVID, flu, mono exposure.  No antibiotics in the past month.  No antipyretic in the past 6 hours.  He has tried Chloraseptic spray and lozenges with very temporary relief.  Symptoms worse with swallowing and with opening his mouth.  He has a past medical history of OSA and migraines.  No history of GERD.  PC[facility]: [facility].  Past Medical History:  Diagnosis Date   Migraine     History reviewed. No pertinent surgical history.  History reviewed. No pertinent family history.  Social History   Tobacco Use   Smoking status: Never    Passive exposure: Never   Smokeless tobacco: Never  Vaping Use   Vaping status: Never Used  Substance Use Topics   Alcohol use: Yes    Comment: small amount   Drug use: Never    No current facility-administered medications for this encounter. No current outpatient medications on file.  Allergies  Allergen Reactions   Pollen Extract Rash    Skin irritation     ROS  As noted in HPI.   Physical Exam  BP (!) 154/80 (BP Location: Right Arm)   Pulse 72   Temp 98.3 F (36.8 C) (Oral)   Resp 15   Ht 5' 7 (1.702 m)   Wt 90.4 kg   SpO2 97%   BMI 31.20 kg/m   Constitutional: Well developed, well nourished, no acute distress Eyes:  EOMI, conjunctiva normal bilaterally HENT: Normocephalic, atraumatic,mucus membranes moist.  No nasal congestion.  Intensely erythematous soft palate on the left with some mild swelling.  Exquisite tenderness.  No drooling, trismus.  Tonsils normal size without exudates.  Uvula midline.  No neck stiffness.  Normal voice.   No drooling, trismus. Respiratory: Normal inspiratory effort Cardiovascular: Normal rate,  GI: Nontender skin: No rash, skin intact Lymph: Left-sided lymphadenopathy .  No posterior cervical lymphadenopathy. Musculoskeletal: no deformities Neurologic: Alert & oriented x 3, no focal neuro deficits Psychiatric: At baseline mental status per caregiver.   ED Course   Medications - No data to display  Orders Placed This Encounter  Procedures   POC rapid strep A    Standing Status:   Standing    Number of Occurrences:   1    Results for orders placed or performed during the hospital encounter of 09/08/24 (from the past 24 hours)  POC rapid strep A     Status: Normal   Collection Time: 09/08/24  2:34 PM  Result Value Ref Range   Rapid Strep A Screen Negative Negative   No results found.  ED Clinical Impression  1. Peritonsillar cellulitis   2. Sore throat      ED Assessment/Plan     Rapid strep negative.  Exam is concerning for early peritonsillar abscess.  Discussed with patient that we could try a trial of antibiotics with the understanding that if he does not improve in 24 hours, or if he gets worse in the interim, he will go immediately to the emergency department, or he can go to the emergency department now to rule this out.  We do not have CT today.  He has opted for ER evaluation now.  He is stable to go via private vehicle.  Discussed labs, rationale for transfer to the emergency department.  He agrees with plan.  No orders of the defined types were placed in this encounter.    *This clinic note was created using Dragon dictation software. Therefore, there may be occasional mistakes despite careful proofreading.     Van Knee, MD 09/08/24 1513

## 2024-09-08 NOTE — ED Notes (Signed)
 Patient is being discharged from the Urgent Care and sent to the [facility] Emergency Department via private vehicle . Per Dr. Van, patient is in need of higher level of care due to possible Tonsilar Abscess. Patient is aware and verbalizes understanding of plan of care.  Vitals:   09/08/24 1421  BP: (!) 154/80  Pulse: 72  Resp: 15  Temp: 98.3 F (36.8 C)  SpO2: 97%

## 2024-09-08 NOTE — Discharge Instructions (Signed)
 I am concerned that you have an early peritonsillar abscess.  I am sending you to the emergency department to rule this out.  I will let them decide what imaging to get.  Let them know if your pain changes or gets worse, if you have difficulty breathing because of throat swelling.

## 2024-09-08 NOTE — Discharge Instructions (Signed)
 If you begin to have difficulty talking aware your voice sounds muffled, you are drooling or having to spit constantly instead of swallowing your saliva please return emergency department.  In the meantime take the amoxicillin  and prednisone  as prescribed.  Gargle with warm salt water.  I do not have concerns for peritonsillar abscess at this time.

## 2024-09-08 NOTE — ED Triage Notes (Signed)
 Patient c/o sore throat and difficulty swallowing for the past 3-4 days.  Patient denies fevers.

## 2024-09-20 ENCOUNTER — Ambulatory Visit: Admission: EM | Admit: 2024-09-20 | Discharge: 2024-09-20 | Disposition: A | Discharge status: Home / Self Care

## 2024-09-20 ENCOUNTER — Ambulatory Visit

## 2024-09-20 ENCOUNTER — Ambulatory Visit: Payer: Self-pay | Admitting: Physician Assistant

## 2024-09-20 DIAGNOSIS — R509 Fever, unspecified: Secondary | ICD-10-CM

## 2024-09-20 DIAGNOSIS — R519 Headache, unspecified: Secondary | ICD-10-CM

## 2024-09-20 DIAGNOSIS — R52 Pain, unspecified: Secondary | ICD-10-CM

## 2024-09-20 LAB — GLUCOSE, POCT (MANUAL RESULT ENTRY): POCT Glucose (KUC): 124 mg/dL — AB (ref 70–99)

## 2024-09-20 LAB — POCT URINE DIPSTICK
Bilirubin, UA: NEGATIVE
Glucose, UA: NEGATIVE mg/dL
Ketones, POC UA: NEGATIVE mg/dL
Leukocytes, UA: NEGATIVE
Nitrite, UA: NEGATIVE
Protein Ur, POC: NEGATIVE mg/dL
Spec Grav, UA: 1.01
Urobilinogen, UA: 4 U/dL — AB
pH, UA: 7

## 2024-09-20 LAB — POC SOFIA SARS ANTIGEN FIA: SARS Coronavirus 2 Ag: NEGATIVE

## 2024-09-20 LAB — POCT INFLUENZA A/B
Influenza A, POC: NEGATIVE
Influenza B, POC: NEGATIVE

## 2024-09-20 LAB — POCT MONO SCREEN (KUC): Mono, POC: NEGATIVE

## 2024-09-20 MED ORDER — ACETAMINOPHEN 325 MG PO TABS
975.0000 mg | ORAL_TABLET | Freq: Once | ORAL | Status: AC
  Administered 2024-09-20: 975 mg via ORAL

## 2024-09-20 NOTE — ED Triage Notes (Signed)
 Pt c/o blurry vision, weakness, headache, chest tightness, bloating, loss of appetite, body chills x1day  Pt is currently taking amoxicillin  for a throat infection -Peritonsillar cellulitis

## 2024-09-20 NOTE — Discharge Instructions (Signed)
-   New and COVID were negative. - Your blood sugar was okay. - No urinary infection. - We obtained an x-ray of your neck and chest and I will call you if anything is abnormal. - Mono was also negative. - I am not sure the cause of your fever.  Could be viral illness, but there are many things that can cause fever and you may need further workup in the emergency department if you are not breaking this fever within the next 48 hours.  Also, if any symptoms worsen you should go to the emergency department.  If worsening headache, neck pain/stiffness, worsening chest pain, shortness of breath, worsening abdominal pain, vomiting, dark or pale stool, blood in stool, painful urination, increased pain in back, increased weakness please go immediately to the emergency department. - In the meantime take ibuprofen  and/or Tylenol as needed for headaches and fever.  Increase rest and fluids.

## 2024-09-20 NOTE — ED Provider Notes (Signed)
 " [facility]    CSN: [id number] Arrival date & time: 09/20/24  1135      History   Chief Complaint Chief Complaint  Patient presents with   Weakness   Dizziness    HPI Shaun Jones is a 28 y.o. male presenting for fever, fatigue, chills, body aches, chest pain, dizziness, nausea, and urinary frequency since yesterday.  Denies cough, congestion, ear pain, sinus pain, wheezing, shortness of breath, abdominal pain, vomiting or diarrhea.  Patient has been taking over-the-counter meds.  Seen 12 days ago at this urgent care and then went to the emergency department for peritonsillar cellulitis.  Has been taking amoxicillin  500 mg 3 times daily.  He says he recently refilled this medicine. Has been taking it for the past 12 days. Sore throat is mild at this time and has decreased from onset. No other complaints.   HPI  Past Medical History:  Diagnosis Date   Migraine     There are no active problems to display for this patient.   History reviewed. No pertinent surgical history.     Home Medications    Prior to Admission medications  Medication Sig Start Date End Date Taking? Authorizing Provider  acetaminophen  (TYLENOL ) 325 MG tablet Take 325 mg by mouth. 01/30/24  Yes [provider]  amoxicillin  (AMOXIL ) 500 MG capsule Take 1 capsule (500 mg total) by mouth 3 (three) times daily. 09/08/24  Yes Mumma, Clotilda, MD  diclofenac Sodium (VOLTAREN) 1 % GEL Apply topically. 07/17/24  Yes [provider]  FLUoxetine (PROZAC) 40 MG capsule Take 40 mg by mouth. 07/12/24  Yes [provider]  hydrOXYzine (ATARAX) 25 MG tablet Take by mouth. 06/07/24  Yes [provider]  naproxen (NAPROSYN) 500 MG tablet Take by mouth. 01/30/24  Yes [provider]  predniSONE  (STERAPRED UNI-PAK 21 TAB) 10 MG (21) TBPK tablet Take 6 pills on day one then decrease by 1 pill each day 09/08/24  Yes Suzanne Clotilda, MD    Family  History History reviewed. No pertinent family history.  Social History Social History[1]   Allergies   Pollen extract   Review of Systems Review of Systems  Constitutional:  Positive for appetite change, chills, fatigue and fever.  HENT:  Positive for sore throat. Negative for congestion, ear pain, rhinorrhea, sinus pain and trouble swallowing.   Eyes:  Positive for visual disturbance.  Respiratory:  Positive for chest tightness. Negative for cough, shortness of breath and wheezing.   Cardiovascular:  Positive for chest pain.  Gastrointestinal:  Positive for nausea. Negative for abdominal pain, diarrhea and vomiting.  Musculoskeletal:  Positive for back pain and myalgias. Negative for neck pain. Arthralgias: chronic. Neurological:  Positive for dizziness and headaches. Negative for light-headedness.  Hematological:  Negative for adenopathy.     Physical Exam Triage Vital Signs ED Triage Vitals  Encounter Vitals Group     BP 09/20/24 1313 133/86     Girls Systolic BP Percentile --      Girls Diastolic BP Percentile --      Boys Systolic BP Percentile --      Boys Diastolic BP Percentile --      Pulse Rate 09/20/24 1313 (!) 103     Resp 09/20/24 1313 12     Temp 09/20/24 1313 (!) 102.1 F (38.9 C)     Temp Source 09/20/24 1313 Oral     SpO2 09/20/24 1313 98 %     Weight 09/20/24 1311 195 lb  3.2 oz (88.5 kg)     Height --      Head Circumference --      Peak Flow --      Pain Score 09/20/24 1310 10     Pain Loc --      Pain Education --      Exclude from Growth Chart --    No data found.  Updated Vital Signs BP 133/86 (BP Location: Left Arm)   Pulse 97   Temp 99 F (37.2 C) (Oral)   Resp 12   Wt 195 lb 3.2 oz (88.5 kg)   SpO2 100%   BMI 30.57 kg/m     Physical Exam Vitals and nursing note reviewed.  Constitutional:      General: He is not in acute distress.    Appearance: Normal appearance. He is well-developed. He is not ill-appearing.  HENT:      Head: Normocephalic and atraumatic.     Nose: Nose normal.     Mouth/Throat:     Mouth: Mucous membranes are moist.     Pharynx: Oropharynx is clear. Posterior oropharyngeal erythema (mild without swelling) present.  Eyes:     General: No scleral icterus.    Conjunctiva/sclera: Conjunctivae normal.  Cardiovascular:     Rate and Rhythm: Regular rhythm. Tachycardia present.  Pulmonary:     Effort: Pulmonary effort is normal. No respiratory distress.     Breath sounds: Normal breath sounds.  Abdominal:     Palpations: Abdomen is soft.     Tenderness: There is abdominal tenderness (mild (2/10 RUQ), mild (3/10 epigastric), mild (1/10 LUQ)). There is no right CVA tenderness, left CVA tenderness, guarding or rebound.  Musculoskeletal:     Cervical back: Neck supple.  Skin:    General: Skin is warm and dry.     Capillary Refill: Capillary refill takes less than 2 seconds.  Neurological:     General: No focal deficit present.     Mental Status: He is alert. Mental status is at baseline.     Motor: No weakness.     Gait: Gait normal.  Psychiatric:        Mood and Affect: Mood normal.        Behavior: Behavior normal.      UC Treatments / Results  Labs (all labs ordered are listed, but only abnormal results are displayed) Labs Reviewed  POCT URINE DIPSTICK - Abnormal; Notable for the following components:      Result Value   Color, UA orange (*)    Clarity, UA cloudy (*)    Blood, UA small (*)    Urobilinogen, UA 4.0 (*)    All other components within normal limits  GLUCOSE, POCT (MANUAL RESULT ENTRY) - Abnormal; Notable for the following components:   POCT Glucose ([facility]) 124 (*)    All other components within normal limits  POCT INFLUENZA A/B - Normal  POC SOFIA SARS ANTIGEN FIA - Normal  POCT MONO SCREEN ([facility]) - Normal    EKG   Radiology DG Neck Soft Tissue Result Date: 09/20/2024 CLINICAL DATA:  Fever chest tightness currently on antibiotic for peritonsillar  cellulitis EXAM: NECK SOFT TISSUES - 1+ VIEW COMPARISON:  None Available. FINDINGS: Epiglottis within normal limits. Subglottic trachea is patent. Mild reversal of cervical lordosis. Prevertebral soft tissues appear slightly thickened, measuring up to 8 mm. No retropharyngeal gas. IMPRESSION: Mild prevertebral soft tissue thickening, measuring up to 8 mm. No retropharyngeal gas. If there is concern for retropharyngeal abscess,  neck CT with contrast would be recommended. These results will be called to the ordering clinician or representative by the Radiologist Assistant, and communication documented in the PACS or [employer]. Electronically Signed   By: Luke Bun M.D.   On: 09/20/2024 15:26   DG Chest 2 View Result Date: 09/20/2024 CLINICAL DATA:  Fever chest pain EXAM: CHEST - 2 VIEW COMPARISON:  12/04/2014 FINDINGS: The heart size and mediastinal contours are within normal limits. Both lungs are clear. The visualized skeletal structures are unremarkable. IMPRESSION: No active cardiopulmonary disease. Electronically Signed   By: Luke Bun M.D.   On: 09/20/2024 15:24    Procedures Procedures (including critical care time)  Medications Ordered in UC Medications  acetaminophen  (TYLENOL ) tablet 975 mg (975 mg Oral Given 09/20/24 1343)    Initial Impression / Assessment and Plan / UC Course  I have reviewed the triage vital signs and the nursing notes.  Pertinent labs & imaging results that were available during my care of the patient were reviewed by me and considered in my medical decision making (see chart for details).   28 year old male presents for fever, fatigue, chest pain, body aches, headaches, chills, mild sore throat, dizziness, urinary frequency, nausea since yesterday.  Patient seen 12 days ago for peritonsillar cellulitis.  Has been on amoxicillin  500 mg 3 times daily x 12 days with improvement in sore throat.  Denies cough, congestion, difficulty swallowing, shortness of  breath, vomiting, diarrhea.  Current temp 100.1 degrees.  Patient given acetaminophen .  Pulse elevated 103 bpm.  Overall well-appearing.  No acute distress.  On exam has no nasal congestion no.  Slight erythema posterior pharynx.  Chest clear.  Heart regular rhythm.  Abdomen soft with mild tenderness throughout the upper abdomen, greatest in the epigastric region.  He rates the worst pain at a 3 out of 10.  Rapid flu/COVID-negative.  Fingerstick glucose 124.  Urinalysis, soft tissue neck (to evaluate for possible retropharyngeal abscess) and chest x-ray obtained.  UA negative for infection.   Mono ordered. Negative.   CXR and neck xray negative on wet read.   Advised patient I will contact him with x-ray results if abnormal.  I did explain that I do not know the cause of his fever at this time.  Explained since he was recently seen and concerned about secondary infection.  He is also still complaining of a mild sore throat despite being on amoxicillin  for 12 days.  Patient declines to go to ED at this time.  Chest x-ray overread is negative.  Neck x-ray over read shows mild prevertebral tissue thickening.  Radiologist states retropharyngeal abscess not ruled out.  Called patient to discuss results.  Explained he really should go to the ED to have a CT of his neck performed to evaluate for abscess.  He states that he will go to the ED tomorrow.  He states that he needs to work today.  We discussed the potential risks of delaying care including threat to life and bodily harm.   Final Clinical Impressions(s) / UC Diagnoses   Final diagnoses:  Fever, unspecified  Febrile illness  Body aches  Acute nonintractable headache, unspecified headache type     Discharge Instructions      - New and COVID were negative. - Your blood sugar was okay. - No urinary infection. - We obtained an x-ray of your neck and chest and I will call you if anything is abnormal. - Mono was also negative. - I  am not sure the cause of your fever.  Could be viral illness, but there are many things that can cause fever and you may need further workup in the emergency department if you are not breaking this fever within the next 48 hours.  Also, if any symptoms worsen you should go to the emergency department.  If worsening headache, neck pain/stiffness, worsening chest pain, shortness of breath, worsening abdominal pain, vomiting, dark or pale stool, blood in stool, painful urination, increased pain in back, increased weakness please go immediately to the emergency department. - In the meantime take ibuprofen  and/or Tylenol  as needed for headaches and fever.  Increase rest and fluids.     ED Prescriptions   None    PDMP not reviewed this encounter.     [1]  Social History Tobacco Use   Smoking status: Never    Passive exposure: Never   Smokeless tobacco: Never  Vaping Use   Vaping status: Never Used  Substance Use Topics   Alcohol use: Yes    Comment: small amount   Drug use: Never     Arvis Jolan NOVAK, PA-C 09/20/24 1611  "

## 2024-09-21 ENCOUNTER — Ambulatory Visit
Admission: EM | Admit: 2024-09-21 | Discharge: 2024-09-21 | Disposition: A | Discharge status: Home / Self Care | Attending: Family Medicine | Admitting: Family Medicine

## 2024-09-21 ENCOUNTER — Emergency Department (HOSPITAL_COMMUNITY)
Admission: EM | Admit: 2024-09-21 | Discharge: 2024-09-21 | Disposition: A | Discharge status: Home / Self Care | Source: Ambulatory Visit | Attending: Emergency Medicine | Admitting: Emergency Medicine

## 2024-09-21 ENCOUNTER — Encounter: Payer: Self-pay | Admitting: Emergency Medicine

## 2024-09-21 ENCOUNTER — Emergency Department (HOSPITAL_COMMUNITY)

## 2024-09-21 ENCOUNTER — Other Ambulatory Visit: Payer: Self-pay

## 2024-09-21 DIAGNOSIS — J029 Acute pharyngitis, unspecified: Secondary | ICD-10-CM | POA: Diagnosis present

## 2024-09-21 DIAGNOSIS — E871 Hypo-osmolality and hyponatremia: Secondary | ICD-10-CM | POA: Insufficient documentation

## 2024-09-21 DIAGNOSIS — J039 Acute tonsillitis, unspecified: Secondary | ICD-10-CM | POA: Insufficient documentation

## 2024-09-21 DIAGNOSIS — M542 Cervicalgia: Secondary | ICD-10-CM | POA: Diagnosis not present

## 2024-09-21 DIAGNOSIS — D72829 Elevated white blood cell count, unspecified: Secondary | ICD-10-CM | POA: Insufficient documentation

## 2024-09-21 LAB — CBC WITH DIFFERENTIAL/PLATELET
Abs Immature Granulocytes: 0.08 K/uL — ABNORMAL HIGH (ref 0.00–0.07)
Basophils Absolute: 0 K/uL (ref 0.0–0.1)
Basophils Relative: 0 %
Eosinophils Absolute: 0 K/uL (ref 0.0–0.5)
Eosinophils Relative: 0 %
HCT: 45.6 % (ref 39.0–52.0)
Hemoglobin: 15.8 g/dL (ref 13.0–17.0)
Immature Granulocytes: 1 %
Lymphocytes Relative: 5 %
Lymphs Abs: 0.9 K/uL (ref 0.7–4.0)
MCH: 30.3 pg (ref 26.0–34.0)
MCHC: 34.6 g/dL (ref 30.0–36.0)
MCV: 87.5 fL (ref 80.0–100.0)
Monocytes Absolute: 2.8 K/uL — ABNORMAL HIGH (ref 0.1–1.0)
Monocytes Relative: 16 %
Neutro Abs: 13.4 K/uL — ABNORMAL HIGH (ref 1.7–7.7)
Neutrophils Relative %: 78 %
Platelets: 205 K/uL (ref 150–400)
RBC: 5.21 MIL/uL (ref 4.22–5.81)
RDW: 12.3 % (ref 11.5–15.5)
WBC: 17.2 K/uL — ABNORMAL HIGH (ref 4.0–10.5)
nRBC: 0 % (ref 0.0–0.2)

## 2024-09-21 LAB — BASIC METABOLIC PANEL WITH GFR
Anion gap: 13 (ref 5–15)
BUN: 13 mg/dL (ref 6–20)
CO2: 24 mmol/L (ref 22–32)
Calcium: 9.4 mg/dL (ref 8.9–10.3)
Chloride: 98 mmol/L (ref 98–111)
Creatinine, Ser: 0.9 mg/dL (ref 0.61–1.24)
GFR, Estimated: >60 mL/min
Glucose, Bld: 104 mg/dL — ABNORMAL HIGH (ref 70–99)
Potassium: 3.7 mmol/L (ref 3.5–5.1)
Sodium: 134 mmol/L — ABNORMAL LOW (ref 135–145)

## 2024-09-21 LAB — MONONUCLEOSIS SCREEN: Mono Screen: NEGATIVE

## 2024-09-21 MED ORDER — DEXAMETHASONE SOD PHOSPHATE PF 10 MG/ML IJ SOLN
8.0000 mg | Freq: Once | INTRAMUSCULAR | Status: AC
  Administered 2024-09-21: 8 mg via INTRAVENOUS

## 2024-09-21 MED ORDER — AMOXICILLIN-POT CLAVULANATE 875-125 MG PO TABS: 1.0000 | ORAL_TABLET | Freq: Two times a day (BID) | ORAL | 0 refills | Status: AC

## 2024-09-21 MED ORDER — IOHEXOL 350 MG/ML SOLN
75.0000 mL | Freq: Once | INTRAVENOUS | Status: AC | PRN
  Administered 2024-09-21: 75 mL via INTRAVENOUS

## 2024-09-21 MED ORDER — SODIUM CHLORIDE 0.9 % IV SOLN
3.0000 g | Freq: Once | INTRAVENOUS | Status: AC
  Administered 2024-09-21: 3 g via INTRAVENOUS
  Filled 2024-09-21: qty 8

## 2024-09-21 MED ORDER — SODIUM CHLORIDE 0.9 % IV BOLUS
1000.0000 mL | Freq: Once | INTRAVENOUS | Status: AC
  Administered 2024-09-21: 1000 mL via INTRAVENOUS

## 2024-09-21 NOTE — ED Provider Triage Note (Signed)
 Emergency Medicine Provider Triage Evaluation Note  Shaun Jones , a 28 y.o. male  was evaluated in triage.  Pt complains of sore throat. Progressive worsening sore throat x 3 days with fever, chills and difficulty swallowing.  Mild abd pain.  Seen yesterday for same, had negative strep test  Review of Systems  Positive: As above Negative: As above  Physical Exam  BP (!) 141/89 (BP Location: Right Arm)   Pulse 86   Temp 99 F (37.2 C)   Resp 18   Ht 5' 7 (1.702 m)   Wt 88.5 kg   SpO2 99%   BMI 30.54 kg/m  Gen:   Awake, no distress   Resp:  Normal effort  MSK:   Moves extremities without difficulty  Other:  Throat exam with bilateral tonsillar enlargement and exudates.  Uvula midline  Medical Decision Making  Medically screening exam initiated at 1:00 PM.  Appropriate orders placed.  Shaun Jones was informed that the remainder of the evaluation will be completed by another provider, this initial triage assessment does not replace that evaluation, and the importance of remaining in the ED until their evaluation is complete.     Nivia Colon, PA-C 09/21/24 1304

## 2024-09-21 NOTE — ED Triage Notes (Signed)
 Patient sent from [facility] for CT soft tissue to eval for abscess. Patient tested negative for covid, flu, and strep. Patient states the L side is worse than the R.

## 2024-09-21 NOTE — ED Provider Notes (Signed)
 " [facility] Provider Note   CSN: [id number] Arrival date & time: 09/21/24  1222     Patient presents with: Sore Throat   Shaun Jones is a 28 y.o. male.   28 year old male with prior medical history as detailed below presents for evaluation.  Patient reports that he developed a sore throat at the beginning of the month.  He was given a course of both amoxicillin  and prednisone .  Symptoms improved very quickly.  About a week later he developed recurrent sore throat and presented to urgent care.  Patient was restarted on amoxicillin  and prednisone .  Urgent care advised him to come to the ED for evaluation of possible peritonsillar abscess.  Patient denies recent fever.  Patient reports that he is feeling a little bit better after taking amoxicillin  for the last 2 to 3 days.  The history is provided by the patient and medical records.       Prior to Admission medications  Medication Sig Start Date End Date Taking? Authorizing Provider  acetaminophen  (TYLENOL ) 325 MG tablet Take 325 mg by mouth. 01/30/24   [provider]  amoxicillin  (AMOXIL ) 500 MG capsule Take 1 capsule (500 mg total) by mouth 3 (three) times daily. 09/08/24   Suzanne Kirsch, MD  diclofenac Sodium (VOLTAREN) 1 % GEL Apply topically. 07/17/24   [provider]  FLUoxetine (PROZAC) 40 MG capsule Take 40 mg by mouth. 07/12/24   [provider]  hydrOXYzine (ATARAX) 25 MG tablet Take by mouth. 06/07/24   [provider]  naproxen (NAPROSYN) 500 MG tablet Take by mouth. 01/30/24   [provider]  predniSONE  (STERAPRED UNI-PAK 21 TAB) 10 MG (21) TBPK tablet Take 6 pills on day one then decrease by 1 pill each day 09/08/24   Suzanne Kirsch, MD    Allergies: Pollen extract    Review of Systems  All other systems reviewed and are negative.   Updated Vital Signs BP (!) 141/89 (BP Location: Right Arm)   Pulse 86   Temp 99  F (37.2 C)   Resp 18   Ht 5' 7 (1.702 m)   Wt 88.5 kg   SpO2 99%   BMI 30.54 kg/m   Physical Exam Vitals and nursing note reviewed.  Constitutional:      General: He is not in acute distress.    Appearance: He is well-developed.  HENT:     Head: Normocephalic and atraumatic.     Mouth/Throat:     Tonsils: Tonsillar exudate and tonsillar abscess present.     Comments: Erythema and tonsillar exudates noted on the left tonsil.  Uvula is midline.  Patient is handling his secretions without difficulty.  Normal speech.  No change in phonation. Eyes:     Conjunctiva/sclera: Conjunctivae normal.  Cardiovascular:     Rate and Rhythm: Normal rate and regular rhythm.     Heart sounds: No murmur heard. Pulmonary:     Effort: Pulmonary effort is normal. No respiratory distress.     Breath sounds: Normal breath sounds.  Abdominal:     Palpations: Abdomen is soft.     Tenderness: There is no abdominal tenderness.  Musculoskeletal:        General: No swelling.     Cervical back: Neck supple.  Skin:    General: Skin is warm and dry.     Capillary Refill: Capillary refill takes less than 2 seconds.  Neurological:     Mental Status:  He is alert.  Psychiatric:        Mood and Affect: Mood normal.     (all labs ordered are listed, but only abnormal results are displayed) Labs Reviewed  BASIC METABOLIC PANEL WITH GFR - Abnormal; Notable for the following components:      Result Value   Sodium 134 (*)    Glucose, Bld 104 (*)    All other components within normal limits  CBC WITH DIFFERENTIAL/PLATELET - Abnormal; Notable for the following components:   WBC 17.2 (*)    Neutro Abs 13.4 (*)    Monocytes Absolute 2.8 (*)    Abs Immature Granulocytes 0.08 (*)    All other components within normal limits  MONONUCLEOSIS SCREEN    EKG: None  Radiology: DG Neck Soft Tissue Result Date: 09/20/2024 CLINICAL DATA:  Fever chest tightness currently on antibiotic for peritonsillar  cellulitis EXAM: NECK SOFT TISSUES - 1+ VIEW COMPARISON:  None Available. FINDINGS: Epiglottis within normal limits. Subglottic trachea is patent. Mild reversal of cervical lordosis. Prevertebral soft tissues appear slightly thickened, measuring up to 8 mm. No retropharyngeal gas. IMPRESSION: Mild prevertebral soft tissue thickening, measuring up to 8 mm. No retropharyngeal gas. If there is concern for retropharyngeal abscess, neck CT with contrast would be recommended. These results will be called to the ordering clinician or representative by the Radiologist Assistant, and communication documented in the PACS or [employer]. Electronically Signed   By: Luke Bun M.D.   On: 09/20/2024 15:26   DG Chest 2 View Result Date: 09/20/2024 CLINICAL DATA:  Fever chest pain EXAM: CHEST - 2 VIEW COMPARISON:  12/04/2014 FINDINGS: The heart size and mediastinal contours are within normal limits. Both lungs are clear. The visualized skeletal structures are unremarkable. IMPRESSION: No active cardiopulmonary disease. Electronically Signed   By: Luke Bun M.D.   On: 09/20/2024 15:24     Procedures   Medications Ordered in the ED  sodium chloride  0.9 % bolus 1,000 mL (has no administration in time range)  Ampicillin -Sulbactam (UNASYN ) 3 g in sodium chloride  0.9 % 100 mL IVPB (has no administration in time range)  dexamethasone  (DECADRON ) injection 8 mg (8 mg Intravenous Given 09/21/24 1447)                                    Medical Decision Making Patient is presenting with persistent sore throat.  He was referred to the ED for evaluation of possible peritonsillar abscess.  Patient is nontoxic.  His exam suggest tonsillitis.  CT imaging does not show PTA.  Tonsillitis seen on CT.  Screening labs are on the whole without significant acute abnormality.  After treatment patient feels much improved.  Labs and CT imaging results discussed with the patient.    He understands need for close  outpatient follow-up.  Strict return precautions given and understood.  Risk Prescription drug management.        Final diagnoses:  Tonsillitis    ED Discharge Orders          Ordered    amoxicillin -clavulanate (AUGMENTIN ) 875-125 MG tablet  Every 12 hours        09/21/24 1612               Laurice Maude BROCKS, MD 09/21/24 1615  "

## 2024-09-21 NOTE — ED Triage Notes (Addendum)
 Pt presents to [facility] for sore throat again today. Pt was seen yesterday for sore throat. Covid, flu, strep, Mono. All negative. Pt had xray of soft tissues of the neck and told he needed a CT for possible abscess of the tonsil. Pt did not go to ED yesterday as directed. He state this morning he woke up and his throat felt tight and he noticed white patches on his throat and wanted to come back here first.

## 2024-09-21 NOTE — ED Triage Notes (Signed)
 Pt bib pov c/o sore throat. Pt states started 4-5 days ago. Pt initially had an abscess that was treated with abx and prednisone .   Two days ago pt started feeling lightheaded, fever, chills, weakness, and nauseous.   Today pt noticed white spots in back of throat.   Pt had a strep throat test done yesterday that was negative.

## 2024-09-21 NOTE — ED Notes (Signed)
 Patient is being discharged from the Urgent Care and sent to the Emergency Department via POV . Per Dr. Kriste, patient is in need of higher level of care due to retropharyngeal abscess. Patient is aware and verbalizes understanding of plan of care.  Vitals:   09/21/24 1108  BP: 135/84  Pulse: 94  Resp: 16  Temp: 99.4 F (37.4 C)  SpO2: 96%

## 2024-09-21 NOTE — Discharge Instructions (Signed)
 Return for any problem.  As discussed, stop taking amoxicillin , and start taking Augmentin .  Please take entire course of Augmentin .  Follow-up with ENT and your PCP as instructed.

## 2024-09-21 NOTE — ED Provider Notes (Signed)
 " [facility]    CSN: [id number] Arrival date & time: 09/21/24  0935      History   Chief Complaint Chief Complaint  Patient presents with   Sore Throat    HPI Shaun Jones is a 28 y.o. male.   HPI  History obtained from the patient. Shaun Jones presents for sore throat with negative strep test yesterday.  Told to get a CT scan of his neck but he hasn't yet.  Woke up this morning, he started having worse throat pain only on the left side. Has been drinking smoothies as it has been difficult to swallow.  Notes that he had some drooling too.  No difficulty breathing.        Past Medical History:  Diagnosis Date   Migraine     There are no active problems to display for this patient.   History reviewed. No pertinent surgical history.     Home Medications    Prior to Admission medications  Medication Sig Start Date End Date Taking? Authorizing Provider  amoxicillin  (AMOXIL ) 500 MG capsule Take 1 capsule (500 mg total) by mouth 3 (three) times daily. 09/08/24  Yes Mumma, Clotilda, MD  predniSONE  (STERAPRED UNI-PAK 21 TAB) 10 MG (21) TBPK tablet Take 6 pills on day one then decrease by 1 pill each day 09/08/24  Yes Mumma, Clotilda, MD  acetaminophen  (TYLENOL ) 325 MG tablet Take 325 mg by mouth. 01/30/24   [provider]  diclofenac Sodium (VOLTAREN) 1 % GEL Apply topically. 07/17/24   [provider]  FLUoxetine (PROZAC) 40 MG capsule Take 40 mg by mouth. 07/12/24   [provider]  hydrOXYzine (ATARAX) 25 MG tablet Take by mouth. 06/07/24   [provider]  naproxen (NAPROSYN) 500 MG tablet Take by mouth. 01/30/24   [provider]    Family History History reviewed. No pertinent family history.  Social History Social History[1]   Allergies   Pollen extract   Review of Systems Review of Systems: negative unless otherwise stated in HPI.      Physical Exam Triage Vital Signs ED Triage Vitals   Encounter Vitals Group     BP 09/21/24 1108 135/84     Girls Systolic BP Percentile --      Girls Diastolic BP Percentile --      Boys Systolic BP Percentile --      Boys Diastolic BP Percentile --      Pulse Rate 09/21/24 1108 94     Resp 09/21/24 1108 16     Temp 09/21/24 1108 99.4 F (37.4 C)     Temp Source 09/21/24 1108 Oral     SpO2 09/21/24 1108 96 %     Weight 09/21/24 1107 195 lb 1.7 oz (88.5 kg)     Height 09/21/24 1107 5' 7 (1.702 m)     Head Circumference --      Peak Flow --      Pain Score 09/21/24 1107 4     Pain Loc --      Pain Education --      Exclude from Growth Chart --    No data found.  Updated Vital Signs BP 135/84 (BP Location: Right Arm)   Pulse 94   Temp 99.4 F (37.4 C) (Oral)   Resp 16   Ht 5' 7 (1.702 m)   Wt 88.5 kg   SpO2 96%   BMI 30.56 kg/m   Visual Acuity Right Eye Distance:  Left Eye Distance:   Bilateral Distance:    Right Eye Near:   Left Eye Near:    Bilateral Near:     Physical Exam GEN:     alert, non-toxic appearing male in no distress    HENT:  mucus membranes moist, oropharyngeal with white exudate/plaque seen on the left edematous tonsil, no nasal discharge, no drooling,  EYES:   no scleral injection or discharge NECK:  normal ROM, +lymphadenopathy, no meningismus   RESP:  no increased work of breathing, airway appears patent CVS:   regular rate and rhythm Skin:   warm and dry    UC Treatments / Results  Labs (all labs ordered are listed, but only abnormal results are displayed) Labs Reviewed - No data to display  EKG   Radiology DG Neck Soft Tissue Result Date: 09/20/2024 CLINICAL DATA:  Fever chest tightness currently on antibiotic for peritonsillar cellulitis EXAM: NECK SOFT TISSUES - 1+ VIEW COMPARISON:  None Available. FINDINGS: Epiglottis within normal limits. Subglottic trachea is patent. Mild reversal of cervical lordosis. Prevertebral soft tissues appear slightly thickened, measuring up to 8  mm. No retropharyngeal gas. IMPRESSION: Mild prevertebral soft tissue thickening, measuring up to 8 mm. No retropharyngeal gas. If there is concern for retropharyngeal abscess, neck CT with contrast would be recommended. These results will be called to the ordering clinician or representative by the Radiologist Assistant, and communication documented in the PACS or [employer]. Electronically Signed   By: Luke Bun M.D.   On: 09/20/2024 15:26     Procedures Procedures (including critical care time)  Medications Ordered in UC Medications - No data to display  Initial Impression / Assessment and Plan / UC Course  I have reviewed the triage vital signs and the nursing notes.  Pertinent labs & imaging results that were available during my care of the patient were reviewed by me and considered in my medical decision making (see chart for details).       Pt is a 28 y.o. male who presents for worsening sore throat pain. Ringo is afebrile here without recent antipyretics. Satting well on room air. Overall pt is non-toxic appearing, well hydrated, without respiratory distress.   He had negative mononucleosis, strep, COVID and influenza testing yesterday.  He had a soft tissue neck x-ray that showed mild prevertebral soft tissue thickening measuring about 8 mm  without retropharyngeal gas.  CT soft tissue neck was recommended.  Urgent care provider yesterday called patient and instructed him to go to the emergency department to rule out a retropharyngeal abscess however patient did not go to the emergency department as instructed.    On exam, he has left-sided tonsillar edema with exudate/plaque visible.  Given the recommendation from yesterday patient was instructed to go to the emergency department for evaluation for retropharyngeal abscess.  He will go to [facility] emergency department in [place] for further evaluation.  Return and ED precautions given and voiced understanding.  Discussed MDM, treatment plan and plan for follow-up with patient who agrees with plan.     Final Clinical Impressions(s) / UC Diagnoses   Final diagnoses:  Sore throat  Neck pain     Discharge Instructions      It was recommended you get a CT Soft Tissue of your neck in the emergency department due to concern for abscess.  You have been advised to follow up immediately in the emergency department for concerning signs or symptoms as discussed during your visit. If you declined  EMS transport, please have a family member take you directly to the ED at this time. Do not delay.   Based on concerns about condition, if you do not follow up in the ED, you may risk poor outcomes including worsening of condition, delayed treatment and potentially life threatening issues. If you have declined to go to the ED at this time, you should call your PCP immediately to set up a follow up appointment.   Go to ED for red flag symptoms, including; fevers you cannot reduce with Tylenol /Motrin , severe headaches, vision changes, numbness/weakness in part of the body, lethargy, confusion, intractable vomiting, severe dehydration, chest pain, breathing difficulty, severe persistent abdominal or pelvic pain, signs of severe infection (increased redness, swelling of an area), feeling faint or passing out, dizziness, etc. You should especially go to the ED for sudden acute worsening of condition if you do not elect to go at this time.       ED Prescriptions   None    PDMP not reviewed this encounter.     [1]  Social History Tobacco Use   Smoking status: Never    Passive exposure: Never   Smokeless tobacco: Never  Vaping Use   Vaping status: Never Used  Substance Use Topics   Alcohol use: Yes    Comment: small amount   Drug use: Never     Kriste Berth, DO 09/21/24 1212  "

## 2024-09-21 NOTE — Discharge Instructions (Addendum)
 It was recommended you get a CT Soft Tissue of your neck in the emergency department due to concern for abscess.  You have been advised to follow up immediately in the emergency department for concerning signs or symptoms as discussed during your visit. If you declined EMS transport, please have a family member take you directly to the ED at this time. Do not delay.   Based on concerns about condition, if you do not follow up in the ED, you may risk poor outcomes including worsening of condition, delayed treatment and potentially life threatening issues. If you have declined to go to the ED at this time, you should call your PCP immediately to set up a follow up appointment.   Go to ED for red flag symptoms, including; fevers you cannot reduce with Tylenol /Motrin , severe headaches, vision changes, numbness/weakness in part of the body, lethargy, confusion, intractable vomiting, severe dehydration, chest pain, breathing difficulty, severe persistent abdominal or pelvic pain, signs of severe infection (increased redness, swelling of an area), feeling faint or passing out, dizziness, etc. You should especially go to the ED for sudden acute worsening of condition if you do not elect to go at this time.

## 2024-10-22 ENCOUNTER — Encounter (INDEPENDENT_AMBULATORY_CARE_PROVIDER_SITE_OTHER): Payer: Self-pay | Admitting: Otolaryngology

## 2024-10-22 ENCOUNTER — Ambulatory Visit (INDEPENDENT_AMBULATORY_CARE_PROVIDER_SITE_OTHER): Admitting: Otolaryngology

## 2024-10-22 VITALS — BP 125/80 | HR 74 | Wt 185.0 lb

## 2024-10-22 DIAGNOSIS — J0391 Acute recurrent tonsillitis, unspecified: Secondary | ICD-10-CM

## 2024-10-22 NOTE — Progress Notes (Signed)
 "  Otolaryngology Clinic Note HPI:  Shaun Jones is a 29 y.o. male kindly referred for evaluation of tonsillitis  Initial visit (10/2024): Had URI sx this past December, with sore throat, some trismus. No sick contacts. Diagnosed with tonsillitis, given multiple rds of abx and steroids. He reports that he is now feeling completely back to normal. No h/o problems with his tonsils, no frequent sore throats or strep infections. No B symptoms. Patient otherwise denies: - dysphagia, odynophagia, aspiration episodes or PNA, need for Heimlich, unintentional weight loss - changes in voice, shortness of breath, hemoptysis tobacco or significant alcohol history - ear pain, neck masses  ENT Surgery: no Personal or FHx of bleeding dz or anesthesia difficulty: no  Tobacco: prior, quit  PMHx: OSA, Migraines  Independent Review of Additional Tests or Records:  Multiple UC and ED notes reviewed (09/08/2024 - Dr. Laney, 09/20/2024 UC, 09/21/2024 Dr. Kriste, 09/21/2024 Dr. Laurice): left sore throat, trismus, tried chlorseptic spray; given amox and pred pack; improved on 09/20/2024, likely another URI and then seen again in ED with Rx Augmentin  09/21/2024 Mono, CBC: mono neg, WBC 17.2 CT Neck 09/21/2024 independently interpreted: noted b/l tonsillar hypertrophy/stranding and lingual tonsil as well but no abscess or PTA; no otherwise necrotic lymph nodes (some b/l level 2 lymphadenopathy); airway patent PMH/Meds/All/SocHx/FamHx/ROS:   Past Medical History:  Diagnosis Date   Migraine      History reviewed. No pertinent surgical history.  History reviewed. No pertinent family history.   Social Connections: Unknown (02/15/2022)   Received from [facility]   Social Network    Social Network: Not on file     Current Medications[1]   Physical Exam:   BP 125/80 (BP Location: Right Arm, Patient Position: Sitting, Cuff Size: Normal)   Pulse 74   Wt 185 lb (83.9 kg)   SpO2 97%   BMI  28.98 kg/m   Salient findings:  CN II-XII intact Bilateral EAC clear and TM intact with well pneumatized middle ear spaces Anterior rhinoscopy: Septum intact; bilateral inferior turbinates without significant hypertrophy No lesions of oral cavity/oropharynx apparent today; tonsils 1/1, normal in appearance without exudate or masses No obviously palpable neck masses/lymphadenopathy/thyromegaly No respiratory distress or stridor  Seprately Identifiable Procedures:  Prior to initiating any procedures, risks/benefits/alternatives were explained to the patient and verbal consent obtained. None  Impression & Plans:  Shaun Jones is a 29 y.o. male with:  1. Acute recurrent tonsillitis    Now back to baseline. No h/o frequent strep infections or PTA. Tonsils appear without lesions today. As such, would recommend observation only. Discussed return precautions F/u PRN  See below regarding exact medications prescribed this encounter including dosages and route: No orders of the defined types were placed in this encounter.     Thank you for allowing me the opportunity to care for your patient. Please do not hesitate to contact me should you have any other questions.  Sincerely, Eldora Blanch, MD Otolaryngologist (ENT), [facility] Phone: [phone] Fax: [phone]  10/22/2024, 9:37 AM   MDM:  [phone] Complexity/Problems addressed: low Data complexity: mod - independent review of notes, labs, independent CT interpretation - Morbidity: low  - Prescription Drug prescribed or managed: n      [1]  Current Outpatient Medications:    acetaminophen  (TYLENOL ) 325 MG tablet, Take 325 mg by mouth., Disp: , Rfl:    diclofenac Sodium (VOLTAREN) 1 % GEL, Apply topically., Disp: , Rfl:    amoxicillin  (AMOXIL ) 500 MG capsule,  Take 1 capsule (500 mg total) by mouth 3 (three) times daily. (Patient not taking: Reported on 10/22/2024), Disp: 30 capsule, Rfl: 0    amoxicillin -clavulanate (AUGMENTIN ) 875-125 MG tablet, Take 1 tablet by mouth every 12 (twelve) hours. (Patient not taking: Reported on 10/22/2024), Disp: 14 tablet, Rfl: 0   FLUoxetine (PROZAC) 40 MG capsule, Take 40 mg by mouth. (Patient not taking: Reported on 10/22/2024), Disp: , Rfl:    hydrOXYzine (ATARAX) 25 MG tablet, Take by mouth. (Patient not taking: Reported on 10/22/2024), Disp: , Rfl:    naproxen (NAPROSYN) 500 MG tablet, Take by mouth. (Patient not taking: Reported on 10/22/2024), Disp: , Rfl:    predniSONE  (STERAPRED UNI-PAK 21 TAB) 10 MG (21) TBPK tablet, Take 6 pills on day one then decrease by 1 pill each day (Patient not taking: Reported on 10/22/2024), Disp: 21 tablet, Rfl: 0  "
